# Patient Record
Sex: Male | Born: 1960 | Race: White | Hispanic: No | Marital: Married | Smoking: Current every day smoker
Health system: Southern US, Community
[De-identification: ages and names within clinical notes are randomized; demographics above are authoritative.]

---

## 2018-07-31 ENCOUNTER — Encounter (HOSPITAL_COMMUNITY): Payer: Self-pay | Admitting: Emergency Medicine

## 2018-07-31 ENCOUNTER — Emergency Department (HOSPITAL_COMMUNITY): Payer: BLUE CROSS/BLUE SHIELD

## 2018-07-31 ENCOUNTER — Inpatient Hospital Stay (HOSPITAL_COMMUNITY): Payer: BLUE CROSS/BLUE SHIELD

## 2018-07-31 ENCOUNTER — Inpatient Hospital Stay (HOSPITAL_COMMUNITY)
Admission: EM | Admit: 2018-07-31 | Discharge: 2018-08-28 | DRG: 917 | Disposition: E | Payer: BLUE CROSS/BLUE SHIELD | Attending: Pulmonary Disease | Admitting: Pulmonary Disease

## 2018-07-31 ENCOUNTER — Other Ambulatory Visit: Payer: Self-pay

## 2018-07-31 DIAGNOSIS — E1010 Type 1 diabetes mellitus with ketoacidosis without coma: Secondary | ICD-10-CM | POA: Diagnosis not present

## 2018-07-31 DIAGNOSIS — R402212 Coma scale, best verbal response, none, at arrival to emergency department: Secondary | ICD-10-CM | POA: Diagnosis present

## 2018-07-31 DIAGNOSIS — I469 Cardiac arrest, cause unspecified: Secondary | ICD-10-CM | POA: Diagnosis not present

## 2018-07-31 DIAGNOSIS — Z794 Long term (current) use of insulin: Secondary | ICD-10-CM | POA: Diagnosis not present

## 2018-07-31 DIAGNOSIS — E1011 Type 1 diabetes mellitus with ketoacidosis with coma: Secondary | ICD-10-CM | POA: Diagnosis present

## 2018-07-31 DIAGNOSIS — G92 Toxic encephalopathy: Secondary | ICD-10-CM | POA: Diagnosis present

## 2018-07-31 DIAGNOSIS — J690 Pneumonitis due to inhalation of food and vomit: Secondary | ICD-10-CM | POA: Diagnosis present

## 2018-07-31 DIAGNOSIS — F17200 Nicotine dependence, unspecified, uncomplicated: Secondary | ICD-10-CM | POA: Diagnosis present

## 2018-07-31 DIAGNOSIS — Z789 Other specified health status: Secondary | ICD-10-CM

## 2018-07-31 DIAGNOSIS — R402312 Coma scale, best motor response, none, at arrival to emergency department: Secondary | ICD-10-CM | POA: Diagnosis present

## 2018-07-31 DIAGNOSIS — N179 Acute kidney failure, unspecified: Secondary | ICD-10-CM | POA: Diagnosis present

## 2018-07-31 DIAGNOSIS — I4510 Unspecified right bundle-branch block: Secondary | ICD-10-CM | POA: Diagnosis present

## 2018-07-31 DIAGNOSIS — J9600 Acute respiratory failure, unspecified whether with hypoxia or hypercapnia: Secondary | ICD-10-CM | POA: Diagnosis not present

## 2018-07-31 DIAGNOSIS — Z66 Do not resuscitate: Secondary | ICD-10-CM | POA: Diagnosis not present

## 2018-07-31 DIAGNOSIS — E871 Hypo-osmolality and hyponatremia: Secondary | ICD-10-CM | POA: Diagnosis present

## 2018-07-31 DIAGNOSIS — T405X1A Poisoning by cocaine, accidental (unintentional), initial encounter: Secondary | ICD-10-CM | POA: Diagnosis present

## 2018-07-31 DIAGNOSIS — R571 Hypovolemic shock: Secondary | ICD-10-CM | POA: Diagnosis present

## 2018-07-31 DIAGNOSIS — E876 Hypokalemia: Secondary | ICD-10-CM | POA: Diagnosis present

## 2018-07-31 DIAGNOSIS — G931 Anoxic brain damage, not elsewhere classified: Secondary | ICD-10-CM | POA: Diagnosis present

## 2018-07-31 DIAGNOSIS — E875 Hyperkalemia: Secondary | ICD-10-CM | POA: Diagnosis present

## 2018-07-31 DIAGNOSIS — J9601 Acute respiratory failure with hypoxia: Secondary | ICD-10-CM | POA: Diagnosis present

## 2018-07-31 DIAGNOSIS — I5030 Unspecified diastolic (congestive) heart failure: Secondary | ICD-10-CM | POA: Diagnosis not present

## 2018-07-31 DIAGNOSIS — Z7951 Long term (current) use of inhaled steroids: Secondary | ICD-10-CM | POA: Diagnosis not present

## 2018-07-31 DIAGNOSIS — J9602 Acute respiratory failure with hypercapnia: Secondary | ICD-10-CM | POA: Diagnosis present

## 2018-07-31 DIAGNOSIS — F319 Bipolar disorder, unspecified: Secondary | ICD-10-CM | POA: Diagnosis present

## 2018-07-31 DIAGNOSIS — J9690 Respiratory failure, unspecified, unspecified whether with hypoxia or hypercapnia: Secondary | ICD-10-CM

## 2018-07-31 DIAGNOSIS — E874 Mixed disorder of acid-base balance: Secondary | ICD-10-CM | POA: Diagnosis present

## 2018-07-31 DIAGNOSIS — I10 Essential (primary) hypertension: Secondary | ICD-10-CM | POA: Diagnosis present

## 2018-07-31 DIAGNOSIS — L8990 Pressure ulcer of unspecified site, unspecified stage: Secondary | ICD-10-CM

## 2018-07-31 DIAGNOSIS — Z515 Encounter for palliative care: Secondary | ICD-10-CM | POA: Diagnosis not present

## 2018-07-31 DIAGNOSIS — R402112 Coma scale, eyes open, never, at arrival to emergency department: Secondary | ICD-10-CM | POA: Diagnosis present

## 2018-07-31 DIAGNOSIS — E10641 Type 1 diabetes mellitus with hypoglycemia with coma: Secondary | ICD-10-CM | POA: Diagnosis present

## 2018-07-31 DIAGNOSIS — J189 Pneumonia, unspecified organism: Secondary | ICD-10-CM

## 2018-07-31 DIAGNOSIS — G9340 Encephalopathy, unspecified: Secondary | ICD-10-CM | POA: Diagnosis not present

## 2018-07-31 DIAGNOSIS — Z9641 Presence of insulin pump (external) (internal): Secondary | ICD-10-CM | POA: Diagnosis present

## 2018-07-31 DIAGNOSIS — I468 Cardiac arrest due to other underlying condition: Secondary | ICD-10-CM | POA: Diagnosis present

## 2018-07-31 LAB — APTT: aPTT: 39 seconds — ABNORMAL HIGH (ref 24–36)

## 2018-07-31 LAB — I-STAT CHEM 8, ED
BUN: 83 mg/dL — ABNORMAL HIGH (ref 6–20)
Calcium, Ion: 1.17 mmol/L (ref 1.15–1.40)
Chloride: 85 mmol/L — ABNORMAL LOW (ref 98–111)
Creatinine, Ser: 3.3 mg/dL — ABNORMAL HIGH (ref 0.61–1.24)
Glucose, Bld: >700 mg/dL (ref 70–99)
HCT: 39 % (ref 39.0–52.0)
HEMOGLOBIN: 13.3 g/dL (ref 13.0–17.0)
Potassium: 7.9 mmol/L (ref 3.5–5.1)
SODIUM: 114 mmol/L — AB (ref 135–145)
TCO2: 10 mmol/L — AB (ref 22–32)

## 2018-07-31 LAB — COMPREHENSIVE METABOLIC PANEL
ALK PHOS: 86 U/L (ref 38–126)
ALT: 90 U/L — AB (ref 0–44)
AST: 159 U/L — AB (ref 15–41)
Albumin: 2.7 g/dL — ABNORMAL LOW (ref 3.5–5.0)
Anion gap: 31 — ABNORMAL HIGH (ref 5–15)
BILIRUBIN TOTAL: 1.3 mg/dL — AB (ref 0.3–1.2)
BUN: 70 mg/dL — AB (ref 6–20)
CALCIUM: 7.8 mg/dL — AB (ref 8.9–10.3)
CO2: 9 mmol/L — ABNORMAL LOW (ref 22–32)
Chloride: 84 mmol/L — ABNORMAL LOW (ref 98–111)
Creatinine, Ser: 3.99 mg/dL — ABNORMAL HIGH (ref 0.61–1.24)
GFR calc Af Amer: 18 mL/min — ABNORMAL LOW (ref >60)
GFR calc non Af Amer: 15 mL/min — ABNORMAL LOW (ref >60)
GLUCOSE: 1242 mg/dL — AB (ref 70–99)
Potassium: 5 mmol/L (ref 3.5–5.1)
Sodium: 125 mmol/L — ABNORMAL LOW (ref 135–145)
TOTAL PROTEIN: 4.6 g/dL — AB (ref 6.5–8.1)

## 2018-07-31 LAB — I-STAT ARTERIAL BLOOD GAS, ED
ACID-BASE DEFICIT: 25 mmol/L — AB (ref 0.0–2.0)
Acid-base deficit: 12 mmol/L — ABNORMAL HIGH (ref 0.0–2.0)
Bicarbonate: 9.8 mmol/L — ABNORMAL LOW (ref 20.0–28.0)
Bicarbonate: 15.5 mmol/L — ABNORMAL LOW (ref 20.0–28.0)
O2 SAT: 99 %
O2 Saturation: 99 %
PCO2 ART: 58.1 mmHg — AB (ref 32.0–48.0)
PH ART: 6.821 — AB (ref 7.350–7.450)
PH ART: 7.232 — AB (ref 7.350–7.450)
TCO2: 12 mmol/L — AB (ref 22–32)
TCO2: 17 mmol/L — ABNORMAL LOW (ref 22–32)
pCO2 arterial: 35.5 mmHg (ref 32.0–48.0)
pO2, Arterial: 264 mmHg — ABNORMAL HIGH (ref 83.0–108.0)
pO2, Arterial: 160 mmHg — ABNORMAL HIGH (ref 83.0–108.0)

## 2018-07-31 LAB — CBC WITH DIFFERENTIAL/PLATELET
Basophils Absolute: 0 10*3/uL (ref 0.0–0.1)
Basophils Relative: 0 %
EOS ABS: 0 10*3/uL (ref 0.0–0.5)
Eosinophils Relative: 0 %
HEMATOCRIT: 42.2 % (ref 39.0–52.0)
HEMOGLOBIN: 11.6 g/dL — AB (ref 13.0–17.0)
LYMPHS ABS: 3.8 10*3/uL (ref 0.7–4.0)
Lymphocytes Relative: 11 %
MCH: 30.8 pg (ref 26.0–34.0)
MCHC: 27.5 g/dL — ABNORMAL LOW (ref 30.0–36.0)
MCV: 111.9 fL — ABNORMAL HIGH (ref 80.0–100.0)
MONO ABS: 1.4 10*3/uL — AB (ref 0.1–1.0)
MONOS PCT: 4 %
Metamyelocytes Relative: 2 %
NRBC: 0 /100{WBCs}
Neutro Abs: 29.7 10*3/uL — ABNORMAL HIGH (ref 1.7–7.7)
Neutrophils Relative %: 83 %
Platelets: 298 10*3/uL (ref 150–400)
RBC: 3.77 MIL/uL — ABNORMAL LOW (ref 4.22–5.81)
RDW: 13.9 % (ref 11.5–15.5)
WBC: 34.9 10*3/uL — ABNORMAL HIGH (ref 4.0–10.5)
nRBC: 0.1 % (ref 0.0–0.2)

## 2018-07-31 LAB — URINALYSIS, ROUTINE W REFLEX MICROSCOPIC
Bacteria, UA: NONE SEEN
Bilirubin Urine: NEGATIVE
Glucose, UA: >=500 mg/dL — AB
KETONES UR: 5 mg/dL — AB
LEUKOCYTES UA: NEGATIVE
Nitrite: NEGATIVE
PROTEIN: NEGATIVE mg/dL
Specific Gravity, Urine: 1.02 (ref 1.005–1.030)
pH: 5 (ref 5.0–8.0)

## 2018-07-31 LAB — CBG MONITORING, ED: Glucose-Capillary: >600 mg/dL (ref 70–99)

## 2018-07-31 LAB — AMYLASE: AMYLASE: 19 U/L — AB (ref 28–100)

## 2018-07-31 LAB — VITAMIN B12: Vitamin B-12: 907 pg/mL (ref 180–914)

## 2018-07-31 LAB — TROPONIN I: TROPONIN I: 0.23 ng/mL — AB (ref <0.03)

## 2018-07-31 LAB — LIPID PANEL
Cholesterol: 77 mg/dL (ref 0–200)
HDL: 34 mg/dL — ABNORMAL LOW (ref >40)
LDL Cholesterol: 21 mg/dL (ref 0–99)
Total CHOL/HDL Ratio: 2.3 RATIO
Triglycerides: 112 mg/dL (ref <150)
VLDL: 22 mg/dL (ref 0–40)

## 2018-07-31 LAB — SAMPLE TO BLOOD BANK

## 2018-07-31 LAB — RAPID URINE DRUG SCREEN, HOSP PERFORMED
AMPHETAMINES: NOT DETECTED
Barbiturates: NOT DETECTED
Benzodiazepines: NOT DETECTED
Cocaine: POSITIVE — AB
OPIATES: NOT DETECTED
Tetrahydrocannabinol: NOT DETECTED

## 2018-07-31 LAB — PHOSPHORUS: Phosphorus: 12 mg/dL — ABNORMAL HIGH (ref 2.5–4.6)

## 2018-07-31 LAB — MAGNESIUM: Magnesium: 3 mg/dL — ABNORMAL HIGH (ref 1.7–2.4)

## 2018-07-31 LAB — PROTIME-INR
INR: 1.56
Prothrombin Time: 18.5 seconds — ABNORMAL HIGH (ref 11.4–15.2)

## 2018-07-31 LAB — GLUCOSE, CAPILLARY: Glucose-Capillary: >600 mg/dL (ref 70–99)

## 2018-07-31 LAB — I-STAT TROPONIN, ED: TROPONIN I, POC: 0.27 ng/mL — AB (ref 0.00–0.08)

## 2018-07-31 LAB — BETA-HYDROXYBUTYRIC ACID: BETA-HYDROXYBUTYRIC ACID: 5.58 mmol/L — AB (ref 0.05–0.27)

## 2018-07-31 LAB — I-STAT CG4 LACTIC ACID, ED: Lactic Acid, Venous: 14.33 mmol/L (ref 0.5–1.9)

## 2018-07-31 LAB — CORTISOL: CORTISOL PLASMA: 51.4 ug/dL

## 2018-07-31 LAB — LIPASE, BLOOD: LIPASE: 20 U/L (ref 11–51)

## 2018-07-31 MED ORDER — FENTANYL CITRATE (PF) 100 MCG/2ML IJ SOLN
100.0000 ug | INTRAMUSCULAR | Status: DC | PRN
  Administered 2018-08-01: 100 ug via INTRAVENOUS
  Filled 2018-07-31: qty 2

## 2018-07-31 MED ORDER — ALBUTEROL SULFATE (2.5 MG/3ML) 0.083% IN NEBU: 2.5000 mg | INHALATION_SOLUTION | RESPIRATORY_TRACT | Status: DC | PRN

## 2018-07-31 MED ORDER — SODIUM CHLORIDE 0.9 % IV SOLN: INTRAVENOUS | Status: DC

## 2018-07-31 MED ORDER — BISACODYL 10 MG RE SUPP: 10.0000 mg | Freq: Every day | RECTAL | Status: DC | PRN

## 2018-07-31 MED ORDER — INSULIN REGULAR(HUMAN) IN NACL 100-0.9 UT/100ML-% IV SOLN
INTRAVENOUS | Status: DC
  Administered 2018-07-31: 16.2 [IU]/h via INTRAVENOUS
  Administered 2018-07-31: 5.4 [IU]/h via INTRAVENOUS
  Administered 2018-08-01: 6.4 [IU]/h via INTRAVENOUS
  Administered 2018-08-01: 21.6 [IU]/h via INTRAVENOUS
  Filled 2018-07-31 (×3): qty 100

## 2018-07-31 MED ORDER — VASOPRESSIN 20 UNIT/ML IV SOLN
0.0300 [IU]/min | INTRAVENOUS | Status: DC
  Administered 2018-07-31: 0.03 [IU]/min via INTRAVENOUS
  Filled 2018-07-31: qty 2

## 2018-07-31 MED ORDER — MIDAZOLAM HCL 2 MG/2ML IJ SOLN
2.0000 mg | INTRAMUSCULAR | Status: DC | PRN
  Administered 2018-08-01 – 2018-08-06 (×20): 2 mg via INTRAVENOUS
  Filled 2018-07-31 (×19): qty 2

## 2018-07-31 MED ORDER — SODIUM CHLORIDE 0.9% FLUSH: 10.0000 mL | INTRAVENOUS | Status: DC | PRN

## 2018-07-31 MED ORDER — ETOMIDATE 2 MG/ML IV SOLN
INTRAVENOUS | Status: AC | PRN
  Administered 2018-07-31: 10 mg via INTRAVENOUS

## 2018-07-31 MED ORDER — FAMOTIDINE IN NACL 20-0.9 MG/50ML-% IV SOLN
20.0000 mg | Freq: Two times a day (BID) | INTRAVENOUS | Status: DC
  Administered 2018-08-01: 20 mg via INTRAVENOUS
  Filled 2018-07-31 (×4): qty 50

## 2018-07-31 MED ORDER — IPRATROPIUM-ALBUTEROL 0.5-2.5 (3) MG/3ML IN SOLN
3.0000 mL | Freq: Once | RESPIRATORY_TRACT | Status: AC
  Administered 2018-07-31: 3 mL via RESPIRATORY_TRACT
  Filled 2018-07-31: qty 3

## 2018-07-31 MED ORDER — STERILE WATER FOR INJECTION IV SOLN
INTRAVENOUS | Status: DC
  Administered 2018-07-31: 23:00:00 via INTRAVENOUS
  Filled 2018-07-31 (×4): qty 850

## 2018-07-31 MED ORDER — SODIUM CHLORIDE 0.9 % IV SOLN
INTRAVENOUS | Status: AC | PRN
  Administered 2018-07-31 (×4): 1000 mL via INTRAVENOUS

## 2018-07-31 MED ORDER — INSULIN ASPART 100 UNIT/ML ~~LOC~~ SOLN
10.0000 [IU] | Freq: Once | SUBCUTANEOUS | Status: AC
  Administered 2018-07-31: 10 [IU] via INTRAVENOUS
  Filled 2018-07-31: qty 1

## 2018-07-31 MED ORDER — SODIUM CHLORIDE 0.9 % IV SOLN: INTRAVENOUS | Status: DC | PRN

## 2018-07-31 MED ORDER — SODIUM BICARBONATE 8.4 % IV SOLN
100.0000 meq | Freq: Once | INTRAVENOUS | Status: AC
  Administered 2018-07-31: 100 meq via INTRAVENOUS

## 2018-07-31 MED ORDER — ROCURONIUM BROMIDE 50 MG/5ML IV SOLN
INTRAVENOUS | Status: AC | PRN
  Administered 2018-07-31: 100 mg via INTRAVENOUS

## 2018-07-31 MED ORDER — VANCOMYCIN HCL 10 G IV SOLR
2000.0000 mg | Freq: Once | INTRAVENOUS | Status: AC
  Administered 2018-08-01: 2000 mg via INTRAVENOUS
  Filled 2018-07-31: qty 2000

## 2018-07-31 MED ORDER — SODIUM BICARBONATE 8.4 % IV SOLN
100.0000 meq | Freq: Once | INTRAVENOUS | Status: AC
  Administered 2018-07-31: 100 meq via INTRAVENOUS
  Filled 2018-07-31: qty 100

## 2018-07-31 MED ORDER — HEPARIN SODIUM (PORCINE) 5000 UNIT/ML IJ SOLN
5000.0000 [IU] | Freq: Three times a day (TID) | INTRAMUSCULAR | Status: DC
  Administered 2018-08-01 – 2018-08-07 (×19): 5000 [IU] via SUBCUTANEOUS
  Filled 2018-07-31 (×19): qty 1

## 2018-07-31 MED ORDER — ORAL CARE MOUTH RINSE
15.0000 mL | OROMUCOSAL | Status: DC
  Administered 2018-08-01 – 2018-08-07 (×65): 15 mL via OROMUCOSAL

## 2018-07-31 MED ORDER — DEXTROSE-NACL 5-0.45 % IV SOLN: INTRAVENOUS | Status: DC

## 2018-07-31 MED ORDER — DOCUSATE SODIUM 50 MG/5ML PO LIQD
100.0000 mg | Freq: Two times a day (BID) | ORAL | Status: DC | PRN
  Filled 2018-07-31: qty 10

## 2018-07-31 MED ORDER — VASOPRESSIN 20 UNIT/ML IV SOLN
0.0300 [IU]/min | INTRAVENOUS | Status: DC
  Filled 2018-07-31: qty 2

## 2018-07-31 MED ORDER — CHLORHEXIDINE GLUCONATE 0.12% ORAL RINSE (MEDLINE KIT)
15.0000 mL | Freq: Two times a day (BID) | OROMUCOSAL | Status: DC
  Administered 2018-08-01 – 2018-08-07 (×14): 15 mL via OROMUCOSAL

## 2018-07-31 MED ORDER — HEPARIN SODIUM (PORCINE) 5000 UNIT/ML IJ SOLN: 5000.0000 [IU] | Freq: Three times a day (TID) | INTRAMUSCULAR | Status: DC

## 2018-07-31 MED ORDER — EPINEPHRINE PF 1 MG/ML IJ SOLN
0.5000 ug/min | INTRAVENOUS | Status: DC
  Administered 2018-07-31: 5 ug/min via INTRAVENOUS
  Filled 2018-07-31: qty 4

## 2018-07-31 MED ORDER — VANCOMYCIN HCL 10 G IV SOLR: 1500.0000 mg | INTRAVENOUS | Status: DC

## 2018-07-31 MED ORDER — MIDAZOLAM HCL 2 MG/2ML IJ SOLN
2.0000 mg | INTRAMUSCULAR | Status: AC | PRN
  Administered 2018-08-01 – 2018-08-02 (×3): 2 mg via INTRAVENOUS
  Filled 2018-07-31 (×5): qty 2

## 2018-07-31 MED ORDER — NOREPINEPHRINE 16 MG/250ML-% IV SOLN
0.0000 ug/min | INTRAVENOUS | Status: DC
  Administered 2018-07-31: 10 ug/min via INTRAVENOUS
  Filled 2018-07-31: qty 250

## 2018-07-31 MED ORDER — NOREPINEPHRINE 16 MG/250ML-% IV SOLN
0.0000 ug/min | Freq: Once | INTRAVENOUS | Status: DC
  Administered 2018-07-31: 5 ug/min via INTRAVENOUS
  Filled 2018-07-31: qty 250

## 2018-07-31 MED ORDER — CALCIUM CHLORIDE 10 % IV SOLN
INTRAVENOUS | Status: AC | PRN
  Administered 2018-07-31: 1 g via INTRAVENOUS

## 2018-07-31 MED ORDER — ACETAMINOPHEN 325 MG PO TABS
650.0000 mg | ORAL_TABLET | ORAL | Status: DC | PRN
  Administered 2018-08-02 – 2018-08-06 (×6): 650 mg via ORAL
  Filled 2018-07-31 (×6): qty 2

## 2018-07-31 MED ORDER — PIPERACILLIN-TAZOBACTAM 3.375 G IVPB 30 MIN
3.3750 g | Freq: Once | INTRAVENOUS | Status: AC
  Administered 2018-08-01: 3.375 g via INTRAVENOUS
  Filled 2018-07-31: qty 50

## 2018-07-31 MED ORDER — EPINEPHRINE PF 1 MG/10ML IJ SOSY
PREFILLED_SYRINGE | INTRAMUSCULAR | Status: AC | PRN
  Administered 2018-07-31 (×5): 1 via INTRAVENOUS

## 2018-07-31 MED ORDER — PIPERACILLIN-TAZOBACTAM 3.375 G IVPB
3.3750 g | Freq: Three times a day (TID) | INTRAVENOUS | Status: AC
  Administered 2018-08-01 – 2018-08-06 (×17): 3.375 g via INTRAVENOUS
  Filled 2018-07-31 (×17): qty 50

## 2018-07-31 MED ORDER — CHLORHEXIDINE GLUCONATE CLOTH 2 % EX PADS
6.0000 | MEDICATED_PAD | Freq: Every day | CUTANEOUS | Status: DC
  Administered 2018-08-01 – 2018-08-06 (×6): 6 via TOPICAL

## 2018-07-31 MED ORDER — NALOXONE HCL 2 MG/2ML IJ SOSY
PREFILLED_SYRINGE | INTRAMUSCULAR | Status: AC | PRN
  Administered 2018-07-31: 4 mg via INTRAVENOUS

## 2018-07-31 MED ORDER — SODIUM CHLORIDE 0.9 % IV SOLN
INTRAVENOUS | Status: DC | PRN
  Administered 2018-08-01: 1000 mL via INTRAVENOUS

## 2018-07-31 MED ORDER — SODIUM CHLORIDE 0.9 % IV BOLUS
2000.0000 mL | Freq: Once | INTRAVENOUS | Status: AC
  Administered 2018-07-31: 2000 mL via INTRAVENOUS

## 2018-07-31 MED ORDER — NALOXONE HCL 0.4 MG/ML IJ SOLN
INTRAMUSCULAR | Status: AC
  Filled 2018-07-31: qty 1

## 2018-07-31 MED ORDER — SODIUM CHLORIDE 0.9 % IV SOLN
1.0000 mg | Freq: Once | INTRAVENOUS | Status: AC
  Administered 2018-08-01: 1 mg via INTRAVENOUS
  Filled 2018-07-31: qty 0.2

## 2018-07-31 MED ORDER — IPRATROPIUM-ALBUTEROL 0.5-2.5 (3) MG/3ML IN SOLN
3.0000 mL | Freq: Four times a day (QID) | RESPIRATORY_TRACT | Status: DC
  Administered 2018-08-01 – 2018-08-07 (×26): 3 mL via RESPIRATORY_TRACT
  Filled 2018-07-31 (×25): qty 3

## 2018-07-31 MED ORDER — THIAMINE HCL 100 MG/ML IJ SOLN
100.0000 mg | Freq: Every day | INTRAMUSCULAR | Status: DC
  Administered 2018-08-01 – 2018-08-07 (×7): 100 mg via INTRAVENOUS
  Filled 2018-07-31 (×7): qty 2

## 2018-07-31 MED ORDER — SODIUM CHLORIDE 0.9% FLUSH
10.0000 mL | Freq: Two times a day (BID) | INTRAVENOUS | Status: DC
  Administered 2018-08-01: 20 mL
  Administered 2018-08-01 – 2018-08-02 (×3): 10 mL
  Administered 2018-08-03: 30 mL
  Administered 2018-08-04: 20 mL
  Administered 2018-08-04: 30 mL
  Administered 2018-08-05 – 2018-08-06 (×3): 10 mL

## 2018-07-31 MED ORDER — SODIUM BICARBONATE 8.4 % IV SOLN
INTRAVENOUS | Status: AC
  Filled 2018-07-31: qty 100

## 2018-07-31 MED ORDER — NOREPINEPHRINE 4 MG/250ML-% IV SOLN: 0.0000 ug/min | Freq: Once | INTRAVENOUS | Status: DC

## 2018-07-31 MED ORDER — FENTANYL CITRATE (PF) 100 MCG/2ML IJ SOLN
100.0000 ug | INTRAMUSCULAR | Status: DC | PRN
  Administered 2018-08-01 (×2): 100 ug via INTRAVENOUS
  Filled 2018-07-31 (×2): qty 2

## 2018-07-31 MED ORDER — ONDANSETRON HCL 4 MG/2ML IJ SOLN: 4.0000 mg | Freq: Four times a day (QID) | INTRAMUSCULAR | Status: DC | PRN

## 2018-07-31 MED ORDER — SODIUM CHLORIDE 0.9 % IV SOLN: 250.0000 mL | INTRAVENOUS | Status: DC

## 2018-07-31 NOTE — Progress Notes (Signed)
Patient transported from ED Trauma C to CT and to 2H21 with no complications.

## 2018-07-31 NOTE — ED Notes (Signed)
Ice packs applied. Goal Temp 36C Per Leitha Bleak NP

## 2018-07-31 NOTE — ED Notes (Signed)
ICU at bedside for central line placement

## 2018-07-31 NOTE — ED Triage Notes (Signed)
Pt arrived [facility] where they were called to a hotel for pt being unresponsive. Pt was recently d/c from a rehab for cocaine use. EMS arrived, pt was agnonal breathing with RA sats of 70s%. Vitals with EMS BP 79/41 P60s CapCO2 38 CBG: HIGH.16LEJ in place. Pt was at EMS bridge when he began to become bradycardic and became pulseless. CPR initiated immediately.

## 2018-07-31 NOTE — ED Notes (Signed)
I Stat Trop I results of 0.27 reported to Dr. Otho Najjar

## 2018-07-31 NOTE — ED Provider Notes (Signed)
[facility] EMERGENCY DEPARTMENT Provider Note   CSN: [id number] Arrival date & time: 08/18/2018  1929     History   Chief Complaint Chief Complaint  Patient presents with  . Cardiac Arrest    HPI Ruben Harmon is a 57 y.o. male.  HPI Patient is a 57 year old with a past medical history of diabetes and drug addiction who presents the emergency department via EMS secondary to a cardiac arrest.  EMS was called to patient's hotel room by his friend secondary to patient being unresponsive.  In route to the emergency department patient lost pulses and CPR was initiated.  Patient was actively receiving CPR at the time of arrival to the emergency department.  EMS estimates they have been covering the patient for approximately 10 minutes prior to arrival to the emergency department.  They state patient's blood glucose was too high to register on their glucometer.  The only history that they were able to obtain from the patient's friend at the hotel was that he had been acting more lethargic for the past 24 hours, had multiple episodes of vomiting, and they did use crack cocaine approximately 24 hours prior to him becoming unresponsive.  They report patient was hypotensive prior to cardiac arrest and had EKG findings concerning for ST elevations diffusely.  Level 5 caveat applies as patient is unable to provide further history secondary to his condition.  History reviewed. No pertinent past medical history.  Patient Active Problem List   Diagnosis Date Noted  . Cardiac arrest ([place]) 08/10/2018    History reviewed. No pertinent surgical history.      Home Medications    Prior to Admission medications   Medication Sig Start Date End Date Taking? Authorizing Provider  amoxicillin-clavulanate (AUGMENTIN) 875-125 MG tablet Take 1 tablet by mouth 2 (two) times daily.   Yes [provider]  buPROPion (WELLBUTRIN XL) 300 MG 24 hr tablet Take 300 mg by mouth daily.   Yes  [provider]  cariprazine (VRAYLAR) capsule Take by mouth.   Yes [provider]  desvenlafaxine (PRISTIQ) 100 MG 24 hr tablet Take 100 mg by mouth daily.   Yes [provider]  fluticasone (FLONASE) 50 MCG/ACT nasal spray Place 1 spray into both nostrils daily.   Yes [provider]  Gabapentin Enacarbil 600 MG TBCR Take 1,200 mg by mouth at bedtime.    Yes [provider]  insulin aspart (NOVOLOG) 100 UNIT/ML injection Inject 50 Units into the skin as directed. With pump   Yes [provider]  Melatonin 5 MG SUBL Place 5 mg under the tongue at bedtime as needed (insomnia).   Yes [provider]  Omega-3 Fatty Acids (FISH OIL) 1000 MG CAPS Take 1,000 mg by mouth daily.    Yes [provider]  Pramipexole Dihydrochloride 4.5 MG TB24 Take 4.5 mg by mouth at bedtime.   Yes [provider]  rosuvastatin (CRESTOR) 40 MG tablet Take 40 mg by mouth daily.   Yes [provider]    Family History No family history on file.  Social History Social History   Tobacco Use  . Smoking status: Current Every Day Smoker  . Smokeless tobacco: Never Used  Substance Use Topics  . Alcohol use: Yes  . Drug use: Yes    Types: Cocaine     Allergies   Patient has no known allergies.   Review of Systems Review of Systems  Unable to perform ROS: Acuity of condition  Physical Exam Updated Vital Signs BP 131/62   Pulse 93   Temp (!) 96.6 F (35.9 C)   Resp (!) 26   Ht 5' 6.5" (1.689 m)   Wt 100 kg   SpO2 100%   BMI 35.05 kg/m   Physical Exam  Constitutional: He appears well-developed and well-nourished.  HENT:  Head: Normocephalic and atraumatic.  Neck: Neck supple. No tracheal deviation present.  Cardiovascular: An irregular rhythm present. Tachycardia present.  Pulmonary/Chest:  Patient apneic at the time of arrival. Breath sounds present bilaterally after intubation but diminished.     Abdominal: Soft. He exhibits no distension.  Musculoskeletal: He exhibits no edema or deformity.  Neurological:  Patient unresponsive without meaningful movement.  Skin: Skin is warm and dry.  Psychiatric:  Unable to assess as patient is unresponsive.   Nursing note and vitals reviewed.    ED Treatments / Results  Labs (all labs ordered are listed, but only abnormal results are displayed) Labs Reviewed  CBC WITH DIFFERENTIAL/PLATELET - Abnormal; Notable for the following components:      Result Value   WBC 34.9 (*)    RBC 3.77 (*)    Hemoglobin 11.6 (*)    MCV 111.9 (*)    MCHC 27.5 (*)    Neutro Abs 29.7 (*)    Monocytes Absolute 1.4 (*)    All other components within normal limits  PROTIME-INR - Abnormal; Notable for the following components:   Prothrombin Time 18.5 (*)    All other components within normal limits  APTT - Abnormal; Notable for the following components:   aPTT 39 (*)    All other components within normal limits  URINALYSIS, ROUTINE W REFLEX MICROSCOPIC - Abnormal; Notable for the following components:   Color, Urine STRAW (*)    Glucose, UA >=500 (*)    Hgb urine dipstick SMALL (*)    Ketones, ur 5 (*)    All other components within normal limits  RAPID URINE DRUG SCREEN, HOSP PERFORMED - Abnormal; Notable for the following components:   Cocaine POSITIVE (*)    All other components within normal limits  LACTIC ACID, PLASMA - Abnormal; Notable for the following components:   Lactic Acid, Venous 12.0 (*)    All other components within normal limits  LACTIC ACID, PLASMA - Abnormal; Notable for the following components:   Lactic Acid, Venous 7.7 (*)    All other components within normal limits  BETA-HYDROXYBUTYRIC ACID - Abnormal; Notable for the following components:   Beta-Hydroxybutyric Acid 5.58 (*)    All other components within normal limits  TROPONIN I - Abnormal; Notable for the following components:   Troponin I 0.39 (*)    All other  components within normal limits  TROPONIN I - Abnormal; Notable for the following components:   Troponin I 2.56 (*)    All other components within normal limits  COMPREHENSIVE METABOLIC PANEL - Abnormal; Notable for the following components:   Sodium 125 (*)    Chloride 84 (*)    CO2 9 (*)    Glucose, Bld 1,242 (*)    BUN 70 (*)    Creatinine, Ser 3.99 (*)    Calcium 7.8 (*)    Total Protein 4.6 (*)    Albumin 2.7 (*)    AST 159 (*)    ALT 90 (*)    Total Bilirubin 1.3 (*)    GFR calc non Af Amer 15 (*)    GFR calc Af Amer 18 (*)  Anion gap 31.0 (*)    All other components within normal limits  LIPID PANEL - Abnormal; Notable for the following components:   HDL 34 (*)    All other components within normal limits  TROPONIN I - Abnormal; Notable for the following components:   Troponin I 0.23 (*)    All other components within normal limits  BASIC METABOLIC PANEL - Abnormal; Notable for the following components:   Sodium 129 (*)    Chloride 92 (*)    CO2 16 (*)    Glucose, Bld 1,101 (*)    BUN 67 (*)    Creatinine, Ser 3.62 (*)    Calcium 7.2 (*)    GFR calc non Af Amer 17 (*)    GFR calc Af Amer 20 (*)    Anion gap 21 (*)    All other components within normal limits  BASIC METABOLIC PANEL - Abnormal; Notable for the following components:   Potassium 3.2 (*)    Glucose, Bld 776 (*)    BUN 64 (*)    Creatinine, Ser 3.12 (*)    Calcium 7.2 (*)    GFR calc non Af Amer 21 (*)    GFR calc Af Amer 24 (*)    All other components within normal limits  BASIC METABOLIC PANEL - Abnormal; Notable for the following components:   Glucose, Bld 524 (*)    BUN 65 (*)    Creatinine, Ser 3.07 (*)    Calcium 7.3 (*)    GFR calc non Af Amer 21 (*)    GFR calc Af Amer 24 (*)    All other components within normal limits  AMYLASE - Abnormal; Notable for the following components:   Amylase 19 (*)    All other components within normal limits  MAGNESIUM - Abnormal; Notable for the  following components:   Magnesium 3.0 (*)    All other components within normal limits  PHOSPHORUS - Abnormal; Notable for the following components:   Phosphorus 12.0 (*)    All other components within normal limits  ACETAMINOPHEN LEVEL - Abnormal; Notable for the following components:   Acetaminophen (Tylenol), Serum <10 (*)    All other components within normal limits  GLUCOSE, CAPILLARY - Abnormal; Notable for the following components:   Glucose-Capillary >600 (*)    All other components within normal limits  LACTIC ACID, PLASMA - Abnormal; Notable for the following components:   Lactic Acid, Venous 4.0 (*)    All other components within normal limits  GLUCOSE, CAPILLARY - Abnormal; Notable for the following components:   Glucose-Capillary >600 (*)    All other components within normal limits  GLUCOSE, CAPILLARY - Abnormal; Notable for the following components:   Glucose-Capillary >600 (*)    All other components within normal limits  GLUCOSE, CAPILLARY - Abnormal; Notable for the following components:   Glucose-Capillary >600 (*)    All other components within normal limits  GLUCOSE, CAPILLARY - Abnormal; Notable for the following components:   Glucose-Capillary >600 (*)    All other components within normal limits  GLUCOSE, CAPILLARY - Abnormal; Notable for the following components:   Glucose-Capillary 548 (*)    All other components within normal limits  MAGNESIUM - Abnormal; Notable for the following components:   Magnesium 2.5 (*)    All other components within normal limits  GLUCOSE, CAPILLARY - Abnormal; Notable for the following components:   Glucose-Capillary 486 (*)    All other components within normal limits  GLUCOSE, CAPILLARY - Abnormal; Notable for the following components:   Glucose-Capillary 379 (*)    All other components within normal limits  GLUCOSE, CAPILLARY - Abnormal; Notable for the following components:   Glucose-Capillary 274 (*)    All other  components within normal limits  GLUCOSE, CAPILLARY - Abnormal; Notable for the following components:   Glucose-Capillary 223 (*)    All other components within normal limits  GLUCOSE, CAPILLARY - Abnormal; Notable for the following components:   Glucose-Capillary 185 (*)    All other components within normal limits  CBG MONITORING, ED - Abnormal; Notable for the following components:   Glucose-Capillary >600 (*)    All other components within normal limits  I-STAT CHEM 8, ED - Abnormal; Notable for the following components:   Sodium 114 (*)    Potassium 7.9 (*)    Chloride 85 (*)    BUN 83 (*)    Creatinine, Ser 3.30 (*)    Glucose, Bld >700 (*)    TCO2 10 (*)    All other components within normal limits  I-STAT TROPONIN, ED - Abnormal; Notable for the following components:   Troponin i, poc 0.27 (*)    All other components within normal limits  I-STAT CG4 LACTIC ACID, ED - Abnormal; Notable for the following components:   Lactic Acid, Venous 14.33 (*)    All other components within normal limits  I-STAT ARTERIAL BLOOD GAS, ED - Abnormal; Notable for the following components:   pH, Arterial 6.821 (*)    pCO2 arterial 58.1 (*)    pO2, Arterial 264.0 (*)    Bicarbonate 9.8 (*)    TCO2 12 (*)    Acid-base deficit 25.0 (*)    All other components within normal limits  CBG MONITORING, ED - Abnormal; Notable for the following components:   Glucose-Capillary >600 (*)    All other components within normal limits  I-STAT ARTERIAL BLOOD GAS, ED - Abnormal; Notable for the following components:   pH, Arterial 7.232 (*)    pO2, Arterial 160.0 (*)    Bicarbonate 15.5 (*)    TCO2 17 (*)    Acid-base deficit 12.0 (*)    All other components within normal limits  CBG MONITORING, ED - Abnormal; Notable for the following components:   Glucose-Capillary >600 (*)    All other components within normal limits  POCT I-STAT 3, ART BLOOD GAS (G3+) - Abnormal; Notable for the following  components:   pH, Arterial 7.337 (*)    pCO2 arterial 29.4 (*)    pO2, Arterial 75.0 (*)    Bicarbonate 16.4 (*)    TCO2 17 (*)    Acid-base deficit 9.0 (*)    All other components within normal limits  POCT I-STAT 3, ART BLOOD GAS (G3+) - Abnormal; Notable for the following components:   pH, Arterial 7.345 (*)    pO2, Arterial 116.0 (*)    All other components within normal limits  MRSA PCR SCREENING  CULTURE, BLOOD (ROUTINE X 2)  CULTURE, BLOOD (ROUTINE X 2)  CULTURE, RESPIRATORY  PROCALCITONIN  LIPASE, BLOOD  CORTISOL  VITAMIN Z60  ETHANOL  SALICYLATE LEVEL  PHOSPHORUS  HIV ANTIBODY (ROUTINE TESTING W REFLEX)  TROPONIN I  BASIC METABOLIC PANEL  BASIC METABOLIC PANEL  BASIC METABOLIC PANEL  MAGNESIUM  MAGNESIUM  PHOSPHORUS  PHOSPHORUS  BASIC METABOLIC PANEL  SAMPLE TO BLOOD BANK    EKG EKG Interpretation  Date/Time:  Sunday July 31 2018 20:04:32 EST Ventricular Rate:  77 PR Interval:    QRS Duration: 137 QT Interval:  417 QTC Calculation: 472 R Axis:   100 Text Interpretation:  Junctional rhythm  RBBB and LPFB No STEMI.  Confirmed by Nanda Quinton [phone]) on 08/11/2018 8:44:10 PM   Radiology Ct Abdomen Pelvis Wo Contrast  Result Date: 08/14/2018 CLINICAL DATA:  Cardiac arrest. Patient was found unresponsive. EXAM: CT CHEST, ABDOMEN AND PELVIS WITHOUT CONTRAST TECHNIQUE: Multidetector CT imaging of the chest, abdomen and pelvis was performed following the standard protocol without IV contrast. COMPARISON:  None. FINDINGS: CT CHEST FINDINGS Cardiovascular: Evaluation of vascular structures is limited without IV contrast material. Normal heart size. No pericardial effusion. Normal caliber thoracic aorta. Scattered aortic calcifications. Mediastinum/Nodes: Endotracheal tube present with tip above the carina. Enteric tube present. Esophagus is mostly decompressed. No significant lymphadenopathy or fluid collection in the mediastinum. Lungs/Pleura: Patchy airspace  infiltrates throughout both lungs with consolidation in the lung bases. This could represent edema, pneumonia, aspiration, or contusion. Airways are patent. No pleural effusions. No pneumothorax. Musculoskeletal: Degenerative changes in the spine. No acute displaced fractures identified. CT ABDOMEN PELVIS FINDINGS Hepatobiliary: Diffuse fatty infiltration of the liver. No gallstones or bile duct dilatation identified. Pancreas: Unenhanced appearance is unremarkable. Spleen: Normal size. No focal lesions identified. Adrenals/Urinary Tract: No adrenal gland nodules. Kidneys are symmetrical in size. No hydronephrosis or hydroureter. No urinary tract stones identified. Foley catheter decompresses the bladder. Gas in the bladder consistent with catheter insertion. No bladder wall thickening. Stomach/Bowel: Enteric tube tip is in the body of the stomach. Stomach, small bowel, and colon are not abnormally distended. Scattered stool throughout the colon. No wall thickening is appreciated. Appendix is not identified. Vascular/Lymphatic: Aortic atherosclerosis. No enlarged abdominal or pelvic lymph nodes. Right femoral arterial and venous catheters are present. Reproductive: Prostate gland is enlarged, measuring about 5.1 cm diameter. Calcification in the vas deferens. Other: No free air or free fluid in the abdomen. Abdominal wall musculature appears intact. Musculoskeletal: Degenerative changes in the spine. No acute fractures identified. IMPRESSION: 1. Patchy airspace infiltrates throughout both lungs with consolidation in the lung bases. This could represent edema, pneumonia, aspiration, or contusion. 2. No acute process demonstrated in the abdomen or pelvis. 3. Diffuse fatty infiltration of the liver. 4. Enlarged prostate gland. Aortic Atherosclerosis (ICD10-I70.0). Electronically Signed   By: Lucienne Capers M.D.   On: 08/13/2018 23:16   Ct Head Wo Contrast  Result Date: 08/20/2018 CLINICAL DATA:  Patient found  down. EXAM: CT HEAD WITHOUT CONTRAST CT CERVICAL SPINE WITHOUT CONTRAST TECHNIQUE: Multidetector CT imaging of the head and cervical spine was performed following the standard protocol without intravenous contrast. Multiplanar CT image reconstructions of the cervical spine were also generated. COMPARISON:  None. FINDINGS: CT HEAD FINDINGS Brain: No subdural, epidural, or subarachnoid hemorrhage. Cerebellum, brainstem, and basal cisterns are normal. Ventricles and sulci are unremarkable. No mass effect or midline shift. No acute cortical ischemia or infarct. Vascular: Calcified atherosclerosis in the intracranial carotids. Skull: Normal. Negative for fracture or focal lesion. Sinuses/Orbits: Opacification of scattered ethmoid air cells in the left sphenoid sinus. Fluid in the maxillary sinuses. Mastoid air cells and middle ears are well aerated. Other: None. CT CERVICAL SPINE FINDINGS Alignment: Normal. Skull base and vertebrae: No acute fracture. No primary bone lesion or focal pathologic process. Soft tissues and spinal canal: No prevertebral fluid or swelling. No visible canal hematoma. Disc levels:  Multilevel degenerative changes. Upper chest: Mild ground-glass in the left apex is likely infectious or inflammatory. ETT and  NG tubes noted. Other: No other abnormalities. IMPRESSION: 1. No acute intracranial abnormalities. 2. Sinus disease as above.  Fluid in the maxillary sinuses. 3. No fracture or traumatic malalignment in the cervical spine. 4. Mild ground-glass in the left lung apex is likely infectious or inflammatory. Electronically Signed   By: Dorise Bullion III M.D   On: 08/27/2018 23:17   Ct Chest Wo Contrast  Result Date: 08/26/2018 CLINICAL DATA:  Cardiac arrest. Patient was found unresponsive. EXAM: CT CHEST, ABDOMEN AND PELVIS WITHOUT CONTRAST TECHNIQUE: Multidetector CT imaging of the chest, abdomen and pelvis was performed following the standard protocol without IV contrast. COMPARISON:  None.  FINDINGS: CT CHEST FINDINGS Cardiovascular: Evaluation of vascular structures is limited without IV contrast material. Normal heart size. No pericardial effusion. Normal caliber thoracic aorta. Scattered aortic calcifications. Mediastinum/Nodes: Endotracheal tube present with tip above the carina. Enteric tube present. Esophagus is mostly decompressed. No significant lymphadenopathy or fluid collection in the mediastinum. Lungs/Pleura: Patchy airspace infiltrates throughout both lungs with consolidation in the lung bases. This could represent edema, pneumonia, aspiration, or contusion. Airways are patent. No pleural effusions. No pneumothorax. Musculoskeletal: Degenerative changes in the spine. No acute displaced fractures identified. CT ABDOMEN PELVIS FINDINGS Hepatobiliary: Diffuse fatty infiltration of the liver. No gallstones or bile duct dilatation identified. Pancreas: Unenhanced appearance is unremarkable. Spleen: Normal size. No focal lesions identified. Adrenals/Urinary Tract: No adrenal gland nodules. Kidneys are symmetrical in size. No hydronephrosis or hydroureter. No urinary tract stones identified. Foley catheter decompresses the bladder. Gas in the bladder consistent with catheter insertion. No bladder wall thickening. Stomach/Bowel: Enteric tube tip is in the body of the stomach. Stomach, small bowel, and colon are not abnormally distended. Scattered stool throughout the colon. No wall thickening is appreciated. Appendix is not identified. Vascular/Lymphatic: Aortic atherosclerosis. No enlarged abdominal or pelvic lymph nodes. Right femoral arterial and venous catheters are present. Reproductive: Prostate gland is enlarged, measuring about 5.1 cm diameter. Calcification in the vas deferens. Other: No free air or free fluid in the abdomen. Abdominal wall musculature appears intact. Musculoskeletal: Degenerative changes in the spine. No acute fractures identified. IMPRESSION: 1. Patchy airspace  infiltrates throughout both lungs with consolidation in the lung bases. This could represent edema, pneumonia, aspiration, or contusion. 2. No acute process demonstrated in the abdomen or pelvis. 3. Diffuse fatty infiltration of the liver. 4. Enlarged prostate gland. Aortic Atherosclerosis (ICD10-I70.0). Electronically Signed   By: Lucienne Capers M.D.   On: 08/15/2018 23:16   Ct Cervical Spine Wo Contrast  Result Date: 08/17/2018 CLINICAL DATA:  Patient found down. EXAM: CT HEAD WITHOUT CONTRAST CT CERVICAL SPINE WITHOUT CONTRAST TECHNIQUE: Multidetector CT imaging of the head and cervical spine was performed following the standard protocol without intravenous contrast. Multiplanar CT image reconstructions of the cervical spine were also generated. COMPARISON:  None. FINDINGS: CT HEAD FINDINGS Brain: No subdural, epidural, or subarachnoid hemorrhage. Cerebellum, brainstem, and basal cisterns are normal. Ventricles and sulci are unremarkable. No mass effect or midline shift. No acute cortical ischemia or infarct. Vascular: Calcified atherosclerosis in the intracranial carotids. Skull: Normal. Negative for fracture or focal lesion. Sinuses/Orbits: Opacification of scattered ethmoid air cells in the left sphenoid sinus. Fluid in the maxillary sinuses. Mastoid air cells and middle ears are well aerated. Other: None. CT CERVICAL SPINE FINDINGS Alignment: Normal. Skull base and vertebrae: No acute fracture. No primary bone lesion or focal pathologic process. Soft tissues and spinal canal: No prevertebral fluid or swelling. No visible canal hematoma. Disc levels:  Multilevel degenerative changes. Upper chest: Mild ground-glass in the left apex is likely infectious or inflammatory. ETT and NG tubes noted. Other: No other abnormalities. IMPRESSION: 1. No acute intracranial abnormalities. 2. Sinus disease as above.  Fluid in the maxillary sinuses. 3. No fracture or traumatic malalignment in the cervical spine. 4. Mild  ground-glass in the left lung apex is likely infectious or inflammatory. Electronically Signed   By: Dorise Bullion III M.D   On: 08/03/2018 23:17   Dg Chest Port 1 View  Result Date: 08/13/2018 CLINICAL DATA:  Status post CPR EXAM: PORTABLE CHEST 1 VIEW COMPARISON:  None. FINDINGS: Cardiac shadow is within normal limits. Endotracheal tube and nasogastric catheter are noted in satisfactory position. The lungs are clear. No bony abnormality is seen. No pneumothorax is noted. IMPRESSION: No acute abnormality.  Tubes and lines as described. Electronically Signed   By: Inez Catalina M.D.   On: 08/05/2018 20:18    Procedures Procedure Name: Intubation Date/Time: 08/09/2018 7:40 PM Performed by: Melina Copa, MD Pre-anesthesia Checklist: Emergency Drugs available, Patient identified, Suction available and Patient being monitored Oxygen Delivery Method: Ambu bag Preoxygenation: Pre-oxygenation with 100% oxygen Induction Type: Rapid sequence Ventilation: Mask ventilation without difficulty Laryngoscope Size: Mac and 4 Grade View: Grade I Tube size: 7.5 mm Number of attempts: 1 Airway Equipment and Method: Stylet and Video-laryngoscopy Placement Confirmation: ETT inserted through vocal cords under direct vision,  Positive ETCO2,  CO2 detector and Breath sounds checked- equal and bilateral Secured at: 23 cm Tube secured with: ETT holder      (including critical care time)  Medications Ordered in ED Medications  naloxone (NARCAN) 0.4 MG/ML injection (has no administration in time range)  insulin regular, human (MYXREDLIN) 100 units/ 100 mL infusion ( Intravenous Rate/Dose Verify 08/01/18 1200)  acetaminophen (TYLENOL) tablet 650 mg (has no administration in time range)  ondansetron (ZOFRAN) injection 4 mg (has no administration in time range)  heparin injection 5,000 Units (5,000 Units Subcutaneous Given 08/01/18 0651)  midazolam (VERSED) injection 2 mg (2 mg Intravenous Given 08/01/18  0509)  midazolam (VERSED) injection 2 mg (2 mg Intravenous Given 08/01/18 1055)  bisacodyl (DULCOLAX) suppository 10 mg (has no administration in time range)  docusate (COLACE) 50 MG/5ML liquid 100 mg (has no administration in time range)  thiamine (B-1) injection 100 mg (100 mg Intravenous Given 08/01/18 0832)  0.9 %  sodium chloride infusion (has no administration in time range)  ipratropium-albuterol (DUONEB) 0.5-2.5 (3) MG/3ML nebulizer solution 3 mL (3 mLs Nebulization Given 08/01/18 1314)  albuterol (PROVENTIL) (2.5 MG/3ML) 0.083% nebulizer solution 2.5 mg (has no administration in time range)  vancomycin (VANCOCIN) 1,500 mg in sodium chloride 0.9 % 500 mL IVPB (has no administration in time range)  piperacillin-tazobactam (ZOSYN) IVPB 3.375 g ( Intravenous Rate/Dose Verify 08/01/18 1200)  sodium chloride flush (NS) 0.9 % injection 10-40 mL (20 mLs Intracatheter Given 08/01/18 0940)  sodium chloride flush (NS) 0.9 % injection 10-40 mL (has no administration in time range)  Chlorhexidine Gluconate Cloth 2 % PADS 6 each (6 each Topical Given 08/01/18 1008)  chlorhexidine gluconate (MEDLINE KIT) (PERIDEX) 0.12 % solution 15 mL (15 mLs Mouth Rinse Given 08/01/18 0800)  MEDLINE mouth rinse (15 mLs Mouth Rinse Given 08/01/18 1200)  0.9 %  sodium chloride infusion ( Intravenous Rate/Dose Verify 08/01/18 1200)  0.9 %  sodium chloride infusion ( Intravenous Paused 08/01/18 0930)  0.9 %  sodium chloride infusion ( Intravenous Rate/Dose Verify 08/01/18 1200)  fentaNYL 2569mg in  NS 239m (134m/ml) infusion-PREMIX (300 mcg/hr Intravenous Rate/Dose Verify 08/01/18 1200)  fentaNYL (SUBLIMAZE) bolus via infusion 50 mcg (has no administration in time range)  feeding supplement (PRO-STAT SUGAR FREE 64) liquid 30 mL (has no administration in time range)  famotidine (PEPCID) IVPB 20 mg premix ( Intravenous Stopped 1137/1/6916789 folic acid (FOLVITE) tablet 1 mg (has no administration in time range)  feeding  supplement (VITAL HIGH PROTEIN) liquid 1,000 mL (has no administration in time range)  Influenza vac split quadrivalent PF (FLUARIX) injection 0.5 mL (has no administration in time range)  pneumococcal 23 valent vaccine (PNU-IMMUNE) injection 0.5 mL (has no administration in time range)  norepinephrine (LEVOPHED) 16 mg in D5W 25040mremix infusion (5 mcg/min Intravenous New Bag/Given 08/01/18 1313)  EPINEPHrine (ADRENALIN) 1 MG/10ML injection (1 Syringe Intravenous Given 08/05/2018 1942)  calcium chloride injection (1 g Intravenous Given 08/08/2018 1928)  etomidate (AMIDATE) injection (10 mg Intravenous Given 08/06/2018 1929)  rocuronium (ZEMURON) injection (100 mg Intravenous Given 08/03/2018 1929)  naloxone (NARCAN) injection (4 mg Intravenous Given 08/08/2018 1937)  insulin aspart (novoLOG) injection 10 Units (10 Units Intravenous Given 08/11/2018 2021)  ipratropium-albuterol (DUONEB) 0.5-2.5 (3) MG/3ML nebulizer solution 3 mL (3 mLs Nebulization Given 07/30/2018 2031)  sodium bicarbonate injection 100 mEq (100 mEq Intravenous Given 08/02/2018 2048)  sodium bicarbonate injection 100 mEq (100 mEq Intravenous Given 11/38/1/0117510folic acid 1 mg in sodium chloride 0.9 % 50 mL IVPB ( Intravenous Stopped 08/01/18 0127)  sodium bicarbonate injection 100 mEq (100 mEq Intravenous Given 08/01/2018 2213)  0.9 %  sodium chloride infusion ( Intravenous Stopped 07/29/2018 2315)  sodium chloride 0.9 % bolus 2,000 mL ( Intravenous Rate/Dose Verify 08/01/18 0000)  piperacillin-tazobactam (ZOSYN) IVPB 3.375 g ( Intravenous Stopped 08/01/18 0159)  vancomycin (VANCOCIN) 2,000 mg in sodium chloride 0.9 % 500 mL IVPB ( Intravenous Stopped 08/01/18 0322)  potassium chloride 10 mEq in 50 mL *CENTRAL LINE* IVPB ( Intravenous Stopped 08/01/18 1053)  potassium chloride 20 MEQ/15ML (10%) solution 40 mEq (40 mEq Per Tube Given 08/01/18 0832)  fentaNYL (SUBLIMAZE) injection 50 mcg (50 mcg Intravenous Given 08/01/18 0802585   Initial Impression /  Assessment and Plan / ED Course  I have reviewed the triage vital signs and the nursing notes.  Pertinent labs & imaging results that were available during my care of the patient were reviewed by me and considered in my medical decision making (see chart for details).     Patient is a 57 44ar old male with a past medical history as detailed above who presents to the emergency department with active cardiac post arrest.  Upon arrival to the emergency department CPR was in progress.  Patient was immediately transferred to the resuscitation bay bed where CPR was continued.  Patient received 5 rounds of epinephrine, calcium, bicarbonate, and Narcan during the resuscitation after which return of spontaneous circulation was achieved.  Prior to return of spontaneous circulation patient was intubated with a 7.5 ET tube.  After achieving return of spontaneous circulation a bedside echo was performed which did not show evidence of tamponade but did show evidence of global hypokinesis.  Repeat EKGs show widened QRS and no specific ST abnormalities.  Laboratory studies were obtained and results are detailed above but briefly do show significantly elevated blood sugars of greater than 700, severe hyperkalemia, and severe lactic acidosis.  Further history was obtained from patient's friend who reports he had been nauseous and vomiting for approximately 24 hours prior to becoming unresponsive.  He is unsure how long he may have been without insulin as his pump appeared to be empty.  He denies any other drugs outside of crack cocaine.  Given patient's severe hyperkalemia he was started on insulin drip while in the emergency department and given albuterol through his ET tube.  Given the acuity of patient's condition the ICU was contacted for admission.  They initiated a code cool for the patient.  For further information regarding patient's continued hospital course please see the ICU team's documentation.  I am concerned  patient's presentation is likely secondary to severe hyperkalemia related to DKA.  Patient does have an elevated troponin that may be likely to ischemia caused by his cardiac arrest, but it cannot be ruled out but this did not contribute to his cardiac arrest at this time.  The care of this patient was discussed with my attending physician Dr. Laverta Baltimore, who voices agreement with work-up and ED disposition.  Final Clinical Impressions(s) / ED Diagnoses   Final diagnoses:  Cardiopulmonary resuscitation (CPR)-only resuscitation status  Diabetic ketoacidosis with coma associated with type 1 diabetes mellitus ([place])  Hyperkalemia    ED Discharge Orders    None       Kiyan Burmester, Chanda Busing, MD 08/01/18 1359    Margette Fast, MD 08/01/18 1727

## 2018-07-31 NOTE — ED Notes (Signed)
NP collected labs.

## 2018-07-31 NOTE — Procedures (Signed)
Central Venous Catheter Insertion Procedure Note Ruben Harmon [id number] [birth year]  Procedure: Insertion of Central Venous Catheter Indications: Assessment of intravascular volume, Drug and/or fluid administration and Frequent blood sampling  Procedure Details Consent: Unable to obtain consent because of emergent medical necessity. Time Out: Verified patient identification, verified procedure, site/side was marked, verified correct patient position, special equipment/implants available, medications/allergies/relevent history reviewed, required imaging and test results available.  Performed  Maximum sterile technique was used including antiseptics, cap, gloves, gown, hand hygiene, mask and sheet. Skin prep: Chlorhexidine; local anesthetic administered A antimicrobial bonded/coated triple lumen catheter was placed in the right femoral vein due to multiple attempts, no other available access using the Seldinger technique.  Evaluation Blood flow good Complications: No apparent complications Patient did tolerate procedure well.  [name], AGACNP-BC [facility]  PCCM Pgr: [phone]

## 2018-07-31 NOTE — ED Notes (Addendum)
I Stat Chem 8 Results of Na 114, K 7.9, Glu >700, I Stat Lacid Acid results of 14.33 reported to Dr. Otho Najjar, and Dr. Jacqulyn Bath

## 2018-07-31 NOTE — Progress Notes (Signed)
Pharmacy Antibiotic Note  Ruben Harmon is a 57 y.o. male admitted on 08/11/2018 with sepsis.  Pharmacy has been consulted for vancomycin and zosyn dosing.  Plan: Vancomycin 2gm IV x 1 then 1500 mg IV q48 hours Zosyn 3.375 gm IV q8 hours F/u renal function, cultures and clinical course  Height: 5' 6.5" (168.9 cm) Weight: 220 lb 7.4 oz (100 kg) IBW/kg (Calculated) : 64.95  Temp (24hrs), Avg:94.9 F (34.9 C), Min:93.7 F (34.3 C), Max:95.7 F (35.4 C)  Recent Labs  Lab 08/21/2018 1949 08/03/2018 1957  WBC 34.9*  --   CREATININE  --  3.30*  LATICACIDVEN  --  14.33*    Estimated Creatinine Clearance: 27.6 mL/min (A) (by C-G formula based on SCr of 3.3 mg/dL (H)).    No Known Allergies   Thank you for allowing pharmacy to be a part of this patient's care.  Talbert Cage [place] 08/11/2018 11:15 PM

## 2018-07-31 NOTE — Procedures (Signed)
Arterial Catheter Insertion Procedure Note Ruben Harmon [id number] [birth year]  Procedure: Insertion of Arterial Catheter  Indications: Blood pressure monitoring  Procedure Details Consent: Unable to obtain consent because of emergent medical necessity. Time Out: Verified patient identification, verified procedure, site/side was marked, verified correct patient position, special equipment/implants available, medications/allergies/relevent history reviewed, required imaging and test results available.  Performed  Maximum sterile technique was used including antiseptics, cap, gloves, gown, hand hygiene, mask and sheet. Skin prep: Chlorhexidine; local anesthetic administered 24 gauge catheter was inserted into right femoral artery using the Seldinger technique. ULTRASOUND GUIDANCE USED: YES Evaluation Blood flow good; BP tracing good. Complications: No apparent complications.  Ruben Harmon, AGACNP-BC [facility]  PCCM Pgr: [phone]

## 2018-07-31 NOTE — Progress Notes (Signed)
RT assisted ED MD Long with intubation of CPR in progress pt that came in coding via EMS. Pt was intubated with a 7.5 ETT, 25@lip , CP 30, tube holder in place and secure, pt measured x3 for a height of 66.5 inches tall (8cc VT of 510).   Vent settings  PRVC VT 510 PEEP 12 FIO2 100 Rate of 15  RT will continue to monitor.

## 2018-08-01 ENCOUNTER — Inpatient Hospital Stay (HOSPITAL_COMMUNITY): Payer: BLUE CROSS/BLUE SHIELD

## 2018-08-01 DIAGNOSIS — E1010 Type 1 diabetes mellitus with ketoacidosis without coma: Secondary | ICD-10-CM

## 2018-08-01 DIAGNOSIS — I5030 Unspecified diastolic (congestive) heart failure: Secondary | ICD-10-CM

## 2018-08-01 DIAGNOSIS — I469 Cardiac arrest, cause unspecified: Secondary | ICD-10-CM

## 2018-08-01 DIAGNOSIS — J9600 Acute respiratory failure, unspecified whether with hypoxia or hypercapnia: Secondary | ICD-10-CM

## 2018-08-01 LAB — POCT I-STAT 3, ART BLOOD GAS (G3+)
Acid-base deficit: 9 mmol/L — ABNORMAL HIGH (ref 0.0–2.0)
BICARBONATE: 25.9 mmol/L (ref 20.0–28.0)
Bicarbonate: 16.4 mmol/L — ABNORMAL LOW (ref 20.0–28.0)
O2 SAT: 96 %
O2 Saturation: 98 %
PCO2 ART: 29.4 mmHg — AB (ref 32.0–48.0)
PH ART: 7.345 — AB (ref 7.350–7.450)
PO2 ART: 116 mmHg — AB (ref 83.0–108.0)
PO2 ART: 75 mmHg — AB (ref 83.0–108.0)
Patient temperature: 33.4
Patient temperature: 36.4
TCO2: 17 mmol/L — AB (ref 22–32)
TCO2: 27 mmol/L (ref 22–32)
pCO2 arterial: 47.3 mmHg (ref 32.0–48.0)
pH, Arterial: 7.337 — ABNORMAL LOW (ref 7.350–7.450)

## 2018-08-01 LAB — BASIC METABOLIC PANEL
ANION GAP: 21 — AB (ref 5–15)
ANION GAP: 11 (ref 5–15)
ANION GAP: 13 (ref 5–15)
ANION GAP: 11 (ref 5–15)
ANION GAP: 14 (ref 5–15)
BUN: 67 mg/dL — ABNORMAL HIGH (ref 6–20)
BUN: 64 mg/dL — ABNORMAL HIGH (ref 6–20)
BUN: 65 mg/dL — ABNORMAL HIGH (ref 6–20)
BUN: 65 mg/dL — AB (ref 6–20)
BUN: 63 mg/dL — AB (ref 6–20)
CALCIUM: 7.6 mg/dL — AB (ref 8.9–10.3)
CALCIUM: 7.9 mg/dL — AB (ref 8.9–10.3)
CHLORIDE: 92 mmol/L — AB (ref 98–111)
CHLORIDE: 98 mmol/L (ref 98–111)
CHLORIDE: 100 mmol/L (ref 98–111)
CO2: 16 mmol/L — ABNORMAL LOW (ref 22–32)
CO2: 26 mmol/L (ref 22–32)
CO2: 26 mmol/L (ref 22–32)
CO2: 27 mmol/L (ref 22–32)
CO2: 25 mmol/L (ref 22–32)
CREATININE: 2.97 mg/dL — AB (ref 0.61–1.24)
Calcium: 7.2 mg/dL — ABNORMAL LOW (ref 8.9–10.3)
Calcium: 7.2 mg/dL — ABNORMAL LOW (ref 8.9–10.3)
Calcium: 7.3 mg/dL — ABNORMAL LOW (ref 8.9–10.3)
Chloride: 104 mmol/L (ref 98–111)
Chloride: 104 mmol/L (ref 98–111)
Creatinine, Ser: 3.62 mg/dL — ABNORMAL HIGH (ref 0.61–1.24)
Creatinine, Ser: 3.12 mg/dL — ABNORMAL HIGH (ref 0.61–1.24)
Creatinine, Ser: 3.07 mg/dL — ABNORMAL HIGH (ref 0.61–1.24)
Creatinine, Ser: 2.85 mg/dL — ABNORMAL HIGH (ref 0.61–1.24)
GFR calc Af Amer: 20 mL/min — ABNORMAL LOW (ref >60)
GFR calc Af Amer: 27 mL/min — ABNORMAL LOW (ref >60)
GFR calc Af Amer: 25 mL/min — ABNORMAL LOW (ref >60)
GFR calc non Af Amer: 22 mL/min — ABNORMAL LOW (ref >60)
GFR, EST AFRICAN AMERICAN: 24 mL/min — AB (ref >60)
GFR, EST AFRICAN AMERICAN: 24 mL/min — AB (ref >60)
GFR, EST NON AFRICAN AMERICAN: 17 mL/min — AB (ref >60)
GFR, EST NON AFRICAN AMERICAN: 21 mL/min — AB (ref >60)
GFR, EST NON AFRICAN AMERICAN: 21 mL/min — AB (ref >60)
GFR, EST NON AFRICAN AMERICAN: 23 mL/min — AB (ref >60)
GLUCOSE: 177 mg/dL — AB (ref 70–99)
GLUCOSE: 160 mg/dL — AB (ref 70–99)
Glucose, Bld: 1101 mg/dL (ref 70–99)
Glucose, Bld: 776 mg/dL (ref 70–99)
Glucose, Bld: 524 mg/dL (ref 70–99)
POTASSIUM: 3.6 mmol/L (ref 3.5–5.1)
POTASSIUM: 3.2 mmol/L — AB (ref 3.5–5.1)
POTASSIUM: 3.6 mmol/L (ref 3.5–5.1)
Potassium: 3.7 mmol/L (ref 3.5–5.1)
Potassium: 4.1 mmol/L (ref 3.5–5.1)
SODIUM: 135 mmol/L (ref 135–145)
Sodium: 129 mmol/L — ABNORMAL LOW (ref 135–145)
Sodium: 139 mmol/L (ref 135–145)
Sodium: 142 mmol/L (ref 135–145)
Sodium: 143 mmol/L (ref 135–145)

## 2018-08-01 LAB — TROPONIN I
TROPONIN I: 2.56 ng/mL — AB (ref <0.03)
TROPONIN I: 2.61 ng/mL — AB (ref <0.03)
Troponin I: 0.39 ng/mL (ref <0.03)

## 2018-08-01 LAB — ACETAMINOPHEN LEVEL: Acetaminophen (Tylenol), Serum: <10 ug/mL — ABNORMAL LOW (ref 10–30)

## 2018-08-01 LAB — LACTIC ACID, PLASMA
LACTIC ACID, VENOUS: 12 mmol/L — AB (ref 0.5–1.9)
Lactic Acid, Venous: 7.7 mmol/L (ref 0.5–1.9)
Lactic Acid, Venous: 4 mmol/L (ref 0.5–1.9)

## 2018-08-01 LAB — GLUCOSE, CAPILLARY
GLUCOSE-CAPILLARY: 548 mg/dL — AB (ref 70–99)
GLUCOSE-CAPILLARY: 379 mg/dL — AB (ref 70–99)
GLUCOSE-CAPILLARY: 223 mg/dL — AB (ref 70–99)
GLUCOSE-CAPILLARY: 133 mg/dL — AB (ref 70–99)
GLUCOSE-CAPILLARY: 121 mg/dL — AB (ref 70–99)
GLUCOSE-CAPILLARY: 195 mg/dL — AB (ref 70–99)
Glucose-Capillary: >600 mg/dL (ref 70–99)
Glucose-Capillary: >600 mg/dL (ref 70–99)
Glucose-Capillary: >600 mg/dL (ref 70–99)
Glucose-Capillary: >600 mg/dL (ref 70–99)
Glucose-Capillary: 486 mg/dL — ABNORMAL HIGH (ref 70–99)
Glucose-Capillary: 274 mg/dL — ABNORMAL HIGH (ref 70–99)
Glucose-Capillary: 185 mg/dL — ABNORMAL HIGH (ref 70–99)
Glucose-Capillary: 159 mg/dL — ABNORMAL HIGH (ref 70–99)
Glucose-Capillary: 128 mg/dL — ABNORMAL HIGH (ref 70–99)
Glucose-Capillary: 136 mg/dL — ABNORMAL HIGH (ref 70–99)

## 2018-08-01 LAB — PHOSPHORUS
PHOSPHORUS: 4.2 mg/dL (ref 2.5–4.6)
PHOSPHORUS: 3.5 mg/dL (ref 2.5–4.6)
Phosphorus: 3.6 mg/dL (ref 2.5–4.6)

## 2018-08-01 LAB — MRSA PCR SCREENING: MRSA BY PCR: NEGATIVE

## 2018-08-01 LAB — MAGNESIUM
Magnesium: 2.5 mg/dL — ABNORMAL HIGH (ref 1.7–2.4)
Magnesium: 2.3 mg/dL (ref 1.7–2.4)
Magnesium: 2.5 mg/dL — ABNORMAL HIGH (ref 1.7–2.4)

## 2018-08-01 LAB — PROCALCITONIN: Procalcitonin: 22.99 ng/mL

## 2018-08-01 LAB — ETHANOL

## 2018-08-01 LAB — ECHOCARDIOGRAM COMPLETE
HEIGHTINCHES: 66.5 in
WEIGHTICAEL: 3527.36 [oz_av]

## 2018-08-01 LAB — HIV ANTIBODY (ROUTINE TESTING W REFLEX): HIV SCREEN 4TH GENERATION: NONREACTIVE

## 2018-08-01 LAB — SALICYLATE LEVEL: Salicylate Lvl: <7 mg/dL (ref 2.8–30.0)

## 2018-08-01 MED ORDER — FENTANYL 2500MCG IN NS 250ML (10MCG/ML) PREMIX INFUSION
25.0000 ug/h | INTRAVENOUS | Status: DC
  Administered 2018-08-01: 50 ug/h via INTRAVENOUS
  Administered 2018-08-01 – 2018-08-02 (×2): 300 ug/h via INTRAVENOUS
  Filled 2018-08-01 (×3): qty 250

## 2018-08-01 MED ORDER — INSULIN ASPART 100 UNIT/ML ~~LOC~~ SOLN
3.0000 [IU] | SUBCUTANEOUS | Status: DC
  Administered 2018-08-01 (×2): 6 [IU] via SUBCUTANEOUS

## 2018-08-01 MED ORDER — NOREPINEPHRINE 16 MG/250ML-% IV SOLN
0.0000 ug/min | INTRAVENOUS | Status: DC
  Administered 2018-08-01: 5 ug/min via INTRAVENOUS
  Administered 2018-08-02: 3 ug/min via INTRAVENOUS
  Filled 2018-08-01 (×2): qty 250

## 2018-08-01 MED ORDER — FENTANYL BOLUS VIA INFUSION
50.0000 ug | INTRAVENOUS | Status: DC | PRN
  Filled 2018-08-01: qty 50

## 2018-08-01 MED ORDER — FENTANYL CITRATE (PF) 100 MCG/2ML IJ SOLN
50.0000 ug | Freq: Once | INTRAMUSCULAR | Status: AC
  Administered 2018-08-01: 50 ug via INTRAVENOUS
  Filled 2018-08-01: qty 2

## 2018-08-01 MED ORDER — INSULIN DETEMIR 100 UNIT/ML ~~LOC~~ SOLN
12.0000 [IU] | Freq: Two times a day (BID) | SUBCUTANEOUS | Status: DC
  Administered 2018-08-01: 12 [IU] via SUBCUTANEOUS
  Filled 2018-08-01 (×2): qty 0.12

## 2018-08-01 MED ORDER — FOLIC ACID 1 MG PO TABS
1.0000 mg | ORAL_TABLET | Freq: Every day | ORAL | Status: DC
  Administered 2018-08-01 – 2018-08-07 (×7): 1 mg
  Filled 2018-08-01 (×7): qty 1

## 2018-08-01 MED ORDER — VITAL HIGH PROTEIN PO LIQD: 1000.0000 mL | ORAL | Status: DC

## 2018-08-01 MED ORDER — VITAL HIGH PROTEIN PO LIQD
1000.0000 mL | ORAL | Status: DC
  Administered 2018-08-02 – 2018-08-06 (×8): 1000 mL
  Filled 2018-08-01: qty 1000

## 2018-08-01 MED ORDER — POTASSIUM CHLORIDE 10 MEQ/50ML IV SOLN
10.0000 meq | INTRAVENOUS | Status: AC
  Administered 2018-08-01 (×3): 10 meq via INTRAVENOUS
  Filled 2018-08-01 (×2): qty 50

## 2018-08-01 MED ORDER — SODIUM CHLORIDE 0.9 % IV SOLN
INTRAVENOUS | Status: DC | PRN
  Administered 2018-08-01: 1000 mL via INTRAVENOUS
  Administered 2018-08-01: 500 mL via INTRAVENOUS
  Administered 2018-08-03: 10:00:00 via INTRAVENOUS

## 2018-08-01 MED ORDER — PRO-STAT SUGAR FREE PO LIQD
30.0000 mL | Freq: Two times a day (BID) | ORAL | Status: DC
  Administered 2018-08-02 – 2018-08-07 (×11): 30 mL
  Filled 2018-08-01 (×12): qty 30

## 2018-08-01 MED ORDER — PNEUMOCOCCAL VAC POLYVALENT 25 MCG/0.5ML IJ INJ: 0.5000 mL | INJECTION | INTRAMUSCULAR | Status: DC | PRN

## 2018-08-01 MED ORDER — FAMOTIDINE IN NACL 20-0.9 MG/50ML-% IV SOLN
20.0000 mg | INTRAVENOUS | Status: DC
  Administered 2018-08-01 – 2018-08-03 (×3): 20 mg via INTRAVENOUS
  Filled 2018-08-01 (×2): qty 50

## 2018-08-01 MED ORDER — CHLORHEXIDINE GLUCONATE CLOTH 2 % EX PADS: 6.0000 | MEDICATED_PAD | Freq: Every day | CUTANEOUS | Status: DC

## 2018-08-01 MED ORDER — INSULIN ASPART 100 UNIT/ML ~~LOC~~ SOLN: 4.0000 [IU] | SUBCUTANEOUS | Status: DC

## 2018-08-01 MED ORDER — INSULIN DETEMIR 100 UNIT/ML ~~LOC~~ SOLN
12.0000 [IU] | Freq: Two times a day (BID) | SUBCUTANEOUS | Status: DC
  Filled 2018-08-01: qty 0.12

## 2018-08-01 MED ORDER — INFLUENZA VAC SPLIT QUAD 0.5 ML IM SUSY: 0.5000 mL | PREFILLED_SYRINGE | INTRAMUSCULAR | Status: DC | PRN

## 2018-08-01 MED ORDER — POTASSIUM CHLORIDE 20 MEQ/15ML (10%) PO SOLN
40.0000 meq | Freq: Once | ORAL | Status: AC
  Administered 2018-08-01: 40 meq
  Filled 2018-08-01: qty 30

## 2018-08-01 MED ORDER — SODIUM CHLORIDE 0.9 % IV SOLN
INTRAVENOUS | Status: DC
  Administered 2018-08-01 – 2018-08-04 (×3): via INTRAVENOUS

## 2018-08-01 MED ORDER — INSULIN DETEMIR 100 UNIT/ML ~~LOC~~ SOLN: 12.0000 [IU] | Freq: Two times a day (BID) | SUBCUTANEOUS | Status: DC

## 2018-08-01 MED FILL — Medication: Qty: 1 | Status: AC

## 2018-08-01 NOTE — Progress Notes (Signed)
Inpatient Diabetes Program Recommendations  AACE/ADA: New Consensus Statement on Inpatient Glycemic Control (2019)  Target Ranges:  Prepandial:   less than 140 mg/dL      Peak postprandial:   less than 180 mg/dL (1-2 hours)      Critically ill patients:  140 - 180 mg/dL   Results for Ruben Harmon, Ruben Harmon (MRN [id number]) as of 08/01/2018 15:20  Ref. Range 08/01/2018 08:11 08/01/2018 09:47 08/01/2018 11:05 08/01/2018 12:02 08/01/2018 13:20  Glucose-Capillary Latest Ref Range: 70 - 99 mg/dL 409 (H) 811 (H) 914 (H) 223 (H) 185 (H)  Results for Ruben Harmon, Ruben Harmon (MRN [id number]) as of 08/01/2018 15:20  Ref. Range 08/17/2018 22:03  Beta-Hydroxybutyric Acid Latest Ref Range: 0.05 - 0.27 mmol/L 5.58 (H)  Glucose Latest Ref Range: 70 - 99 mg/dL 0,865 (HH)   Review of Glycemic Control  DM history: DM60 (dx at 57 years old; makes NO insulin and will require basal, correction, and meal coverage insulin) Outpatient Diabetes medications: Medtronic insulin pump with Novolog Current orders for Inpatient glycemic control: IV insulin drip per DKA   Inpatient Diabetes Program Recommendations:  Insulin - Basal: Once MD is ready to transition off IV insulin drip, please consider ordering Lantus 20 units Q24H (based on 100 kg x 0.2 units). Correction (SSI): Once MD is ready to transition off IV insulin drip, please consider ordering CBGs with Novolog 0-9 units Q4H. HbgA1C: Please consider ordering an A1C to evaluate glycemic control over the past 2-3 months.  NOTE: Noted consult for Diabetes Coordinator. Patient has DM hx and uses an insulin pump for DM control outpatient. Patient was found unresponsive in hotel room and noted to be in DKA with initial glucose of 1242 mg/dl on 78/4/69. Patient is currently on ventilator and not able to provide any information regarding DM control or insulin pump. Medtronic MiniMed Paradigm at bedside (no insulin reservoir or tubing attached).  Diabetes Coordinator called [phone] [employer] number  to assist with obtaining insulin pump settings despite not having an insulin reservoir in place. Able to get to insulin pump settings which are as follows:  Basal  12A 1.20 units/hour 6:30A 1.3 units/hour 6:30P 1.0 units/hour Total basal: 28.9 units/ 24 hours  NO insulin sensitivity or carb ratio in bolus settings in insulin pump.  Returned insulin pump back to patient's room; patient's nurse Denny Peon, RN and patient's wife are present in room at this time. Patient's wife states they have been separated but she was able to provide some very general information. She stated patient has DM42 (dx at 57 years old) and he has been on an insulin pump for about 4 years. She states that patient had A1C in 8% range in Feb 2019. She states that over the past several months, his DM control has not been as good.  Discussed patient is currently on insulin drip but will be transitioned to SQ insulin when MD feels appropriate. Inquired if wife wanted to take patient's insulin pump with her and she asked that it be kept at the hospital. Asked RN to have security lock up insulin pump so it is not misplaced. If patient has no insulin pump supplies here at the hospital in his belongings, he will need to use SQ insulin injections while inpatient for glycemic control. Discussed SQ insulin recommendations with Denny Peon, RN.  Will continue to follow.  Thanks, Orlando Penner, RN, MSN, CDE Diabetes Coordinator Inpatient Diabetes Program [phone] (Team Pager from 8am to 5pm)

## 2018-08-01 NOTE — Progress Notes (Signed)
EEG completed, results pending. 

## 2018-08-01 NOTE — Progress Notes (Signed)
Initial Nutrition Assessment  DOCUMENTATION CODES:   Obesity unspecified  INTERVENTION:   -Vital High Protein @ 50 ml/hr (1200 ml) via OGT -30 ml Prostat BID  Provides: 1400 kcals, 135 grams protein, 1008 ml free water. Meets 100% of needs.   NUTRITION DIAGNOSIS:   Inadequate oral intake related to inability to eat as evidenced by NPO status.  GOAL:   Patient will meet greater than or equal to 90% of their needs  MONITOR:   Diet advancement, Vent status, Labs, Weight trends, TF tolerance, I & O's  REASON FOR ASSESSMENT:   Consult Enteral/tube feeding initiation and management  ASSESSMENT:   Patient with PMH significant for tobacco abuse, cocaine abuse (recently left rehab 11/2), DM (insulin pump?) and HTN. Presents this admission after being found unresponsive in a hotel room. Admitted for asystole cardiac arrest with concern for hypoxic/anoxic injury and DKA.    Pt on TTM and requiring very low water temps to maintain 36C. Off pressors at this time. No family at bedside to provide nutrition history. Minimal weight history, suspect 100 kg is a stated weight. RD attempted to get bed weight, but is unsure if bed was zeroed out. Will base needs off 100 kg and adjust for accurate weight once obtained.   Patient is currently intubated on ventilator support MV: 13.2 L/min Temp (24hrs), Avg:95.8 F (35.4 C), Min:92.8 F (33.8 C), Max:97.5 F (36.4 C) BP: 131/62 MAP: 81  I/O: +6.4 L since admit UOP: 1575 ml x 24 hrs OGT: 50 ml x 24 hrs  Medications reviewed and include: folic acid, thiamine, NS @ 75 ml/hr, insulin gtt, fentanyl Labs reviewed: CBG 524-776 Mg 2.5 (L)   NUTRITION - FOCUSED PHYSICAL EXAM:    Most Recent Value  Orbital Region  No depletion  Upper Arm Region  Unable to assess  Thoracic and Lumbar Region  Unable to assess  Buccal Region  No depletion  Temple Region  No depletion  Clavicle Bone Region  No depletion  Clavicle and Acromion Bone Region  No  depletion  Scapular Bone Region  Unable to assess  Dorsal Hand  No depletion  Patellar Region  No depletion  Anterior Thigh Region  Unable to assess  Posterior Calf Region  No depletion  Edema (RD Assessment)  None     Diet Order:   Diet Order            Diet NPO time specified  Diet effective now              EDUCATION NEEDS:   Not appropriate for education at this time  Skin:  Skin Assessment: Reviewed RN Assessment  Last BM:  PTA  Height:   Ht Readings from Last 1 Encounters:  08/27/18 5' 6.5" (1.689 m)    Weight:   Wt Readings from Last 1 Encounters:  2018/08/27 100 kg    Ideal Body Weight:  67.3 kg  BMI:  Body mass index is 35.05 kg/m.  Estimated Nutritional Needs:   Kcal:  1100-1400 kcal  Protein:  135-150 grams  Fluid:  >/= 1.5 L/day   Vanessa Kick RD, LDN Clinical Nutrition Pager # - [phone]

## 2018-08-01 NOTE — Procedures (Signed)
ELECTROENCEPHALOGRAM REPORT   Patient: Ruben Harmon       Room #: 1Y78G  Age: 57 y.o.        Sex: male Referring Physician: Marcos Eke Report Date:  08/01/2018        Interpreting Physician: Thana Farr  History: [name] is an 57 y.o. male s/p arrest  Medications:  Insulin, Versed, Fentanyl, Vancomycin, Zosyn, Folvite, Pepcid  Conditions of Recording:  This is a 21 channel routine scalp EEG performed with bipolar and monopolar montages arranged in accordance to the international 10/20 system of electrode placement. One channel was dedicated to EKG recording.  The patient is in the intubated and sedated state.  Description:  Artifact is prominent during the recording often obscuring the background rhythm. When able to be visualized the background is slow and poorly organized.   It consists of a low voltage, polymorphic delta rhythm that is diffusely distributed and continuous.  There is occasional intermixed poorly organized theta activity as well.   Although the patient is stimulated multiple times during the recording due to the artifact it is difficult to determine if there is some reactivity of the background rhythm. No epileptiform activity is noted.   Hyperventilation and intermittent photic stimulation were not performed.   IMPRESSION: This is an abnormal EEG secondary to general background slowing.  This finding may be seen with a diffuse disturbance that is etiologically nonspecific, but may include a metabolic encephalopathy or medication effect, among other possibilities.  No epileptiform activity was noted.     Thana Farr, MD Neurology [phone] 08/01/2018, 1:06 PM

## 2018-08-01 NOTE — Progress Notes (Signed)
  Echocardiogram 2D Echocardiogram has been performed.  Ruben Harmon 08/01/2018, 8:49 AM

## 2018-08-01 NOTE — H&P (Addendum)
NAME:  Ruben Harmon, MRN:  [id number], DOB:  [birth year], LOS: 1 ADMISSION DATE:  08/18/2018, CONSULTATION DATE:  11/3 REFERRING MD:  Otho Najjar, CHIEF COMPLAINT:  Cardiac Arrest    History of Present Illness   57 year old male presents to ED after being found unresponsive in hotel room. Patient was recently in Rehab (for cocaine) and left on 11/2. When EMS arrived patient was hypoxic and hypotensive with "high" glucose. On arrival to ED patient had a witnessed 10 minute asystolic arrest. Intubated and started on EPI. Glucose 1200, K 7.9, LA 14.33. Patient did have an insulin pump in place. Post arrest patient remained unresponsive. PCCM asked to admit.   Wife contact via phone. States that she is an NP in [place] and her husband has battled polysubstance abuse for years and recently was placed in Rehab. Was suppose to be d/c 11/2 however no one could find him. His friends called the wife today and told her they had been doing drugs and that the patient would not wake up.   Past Medical History  Tobacco Use, Cocaine Use, HTN    Significant Hospital Events   11/3 > Presents to ED   Consults: date of consult/date signed off & final recs:  PCCM 11/3   Procedures (surgical and bedside):  ETT 11/3 >> Right Femoral CVC 11/3>> Right Femoral Aline 11/3 >>   Significant Diagnostic Tests:  CXR 11/3 > Cardiac shadow is within normal limits. Endotracheal tube and nasogastric catheter are noted in satisfactory position. The lungs are clear. No bony abnormality is seen. No pneumothorax is noted. CT Head/C-Spine 11/4 >> CT Chest/A 11/4>> ECHO 11/4 >> EEG 11/4 >>   Micro Data:  Blood 11/3 >> Urine 11/3 >> Tracheal Asp 11/3 >>   Antimicrobials:  Zosyn 11/4 >> Vancomycin 11/4 >>    Subjective:    Objective   Blood pressure (!) 147/61, pulse 90, temperature (!) 92.8 F (33.8 C), temperature source Bladder, resp. rate (!) 23, height 5' 6.5" (1.689 m), weight 100 kg, SpO2 100 %.    Vent  Mode: PRVC FiO2 (%):  [60 %-100 %] 60 % Set Rate:  [15 bmp-30 bmp] 24 bmp Vt Set:  [510 mL] 510 mL PEEP:  [5 cmH20-12 cmH20] 5 cmH20 Plateau Pressure:  [25 cmH20-27 cmH20] 25 cmH20  No intake or output data in the 24 hours ending 08/01/18 0022 Filed Weights   Aug 18, 2018 2033  Weight: 100 kg    Examination: General: Adult male, on vent  HENT: Dry MM, Dry brown secretions noted around mouth  Lungs: Exp Wheeze, no crackles  Cardiovascular: Irregular, no MRG Abdomen: Distended, active bowel sounds  Extremities: -edema  Neuro: pupils 2, non-reactive, does not respond to physical/verbal stimulation, no cough/gag GU: foley in place   Resolved Hospital Problem list     Assessment & Plan:   Respiratory Insufficieny s/p Cardiac Arrest  Concern for Aspiration Event  H/O Tobacco Use Plan Plan  -Vent Support -Trend ABG/CXR -Duoneb/PRN Albuterol  -Pulmonary Hygiene  -VAP bundle   Asystole Cardiac Arrest (Witnessed 10 minutes) Septic vs Hypovolemic Shock  H/O HTN Plan -Cardiac Monitoring  -Hypothermia (36) Protocol  -Maintain MAP >75 -Currently on Vasopressin/Levophed  -Trend Troponin  -ECHO pending    Severe Anion Gap Metabolic Acidosis with Lactic Acidosis  Acute Kidney Injury  Pseudo Hypokalemia in setting of DKA Hyponatremia Plan  -Trend BMP -Trend LA  -Bicarb gttt @ 125 ml/hr (Received 6 amps bicarb in ED) -To receive a total of 6L  bolus   SUP Plan  -NPO -OG to LWS -Pepcid   DKA -Beta-H 5.58  H/O DM Plan  -Trend Glucose  -Insulin gtt per DKA protocol   Leukocytosis ?Aspiration Event  -noted brown secretions around and in mouth Plan  -Trend WBC and Fever Curve -Trend PCT  -Follow Culture Data -Zosyn/Vancomycin     Encephalopathy, Metabolic vs Anoxic Injury  H/O Cocaine Use Plan  -EEG -CT Head/C-Spine pending -If Neuro status remains poor consider MRI in 24 hours  -Folic Acid/Thiamine (Presumed ETOH) -ETOH/Asipirin/Tylenol levels pending    Disposition / Summary of Today's Plan 08/01/18   Undergoing hypothermia protocol. Follow imaging and labs. Possible MRI in next 24 hours if no improvement in neuro status.       Diet: NPO VAP protocol DVT prophylaxis: Heparin  GI prophylaxis: Pepcid  Hyperglycemia protocol Mobility: Bedrest Code Status: FC Family Communication: Wife updated on phone   Labs   CBC: Recent Labs  Lab 08/14/2018 1949 08/03/2018 1957  WBC 34.9*  --   NEUTROABS 29.7*  --   HGB 11.6* 13.3  HCT 42.2 39.0  MCV 111.9*  --   PLT 298  --     Basic Metabolic Panel: Recent Labs  Lab 08/03/2018 1957 08/20/2018 2203  NA 114* 125*  K 7.9* 5.0  CL 85* 84*  CO2  --  9*  GLUCOSE >700* 1,242*  BUN 83* 70*  CREATININE 3.30* 3.99*  CALCIUM  --  7.8*  MG  --  3.0*  PHOS  --  12.0*   GFR: Estimated Creatinine Clearance: 22.8 mL/min (A) (by C-G formula based on SCr of 3.99 mg/dL (H)). Recent Labs  Lab 08/05/2018 1949 08/23/2018 1957 08/05/2018 2203  WBC 34.9*  --   --   LATICACIDVEN  --  14.33* 12.0*    Liver Function Tests: Recent Labs  Lab 08/02/2018 2203  AST 159*  ALT 90*  ALKPHOS 86  BILITOT 1.3*  PROT 4.6*  ALBUMIN 2.7*   Recent Labs  Lab 08/14/2018 2203  LIPASE 20  AMYLASE 19*   No results for input(s): AMMONIA in the last 168 hours.  ABG    Component Value Date/Time   PHART 7.337 (L) 08/01/2018 0006   PCO2ART 29.4 (L) 08/01/2018 0006   PO2ART 75.0 (L) 08/01/2018 0006   HCO3 16.4 (L) 08/01/2018 0006   TCO2 17 (L) 08/01/2018 0006   ACIDBASEDEF 9.0 (H) 08/01/2018 0006   O2SAT 96.0 08/01/2018 0006     Coagulation Profile: Recent Labs  Lab 08/26/2018 1949  INR 1.56    Cardiac Enzymes: Recent Labs  Lab 08/24/2018 2203  TROPONINI 0.23*    HbA1C: No results found for: HGBA1C  CBG: Recent Labs  Lab 08/06/2018 1935 08/06/2018 2131 08/09/2018 2233 08/15/2018 2338  GLUCAP >600* >600* >600* >600*    Review of Systems:   Unable to review as patient is intubated/unresponsive    Past Medical History  He,  has no past medical history on file.   Surgical History   History reviewed. No pertinent surgical history.   Social History   Social History   Socioeconomic History  . Marital status: Married    Spouse name: Not on file  . Number of children: Not on file  . Years of education: Not on file  . Highest education level: Not on file  Occupational History  . Not on file  Social Needs  . Financial resource strain: Not on file  . Food insecurity:    Worry: Not on file  Inability: Not on file  . Transportation needs:    Medical: Not on file    Non-medical: Not on file  Tobacco Use  . Smoking status: Current Every Day Smoker  . Smokeless tobacco: Never Used  Substance and Sexual Activity  . Alcohol use: Yes  . Drug use: Yes    Types: Cocaine  . Sexual activity: Not on file  Lifestyle  . Physical activity:    Days per week: Not on file    Minutes per session: Not on file  . Stress: Not on file  Relationships  . Social connections:    Talks on phone: Not on file    Gets together: Not on file    Attends religious service: Not on file    Active member of club or organization: Not on file    Attends meetings of clubs or organizations: Not on file    Relationship status: Not on file  . Intimate partner violence:    Fear of current or ex partner: Not on file    Emotionally abused: Not on file    Physically abused: Not on file    Forced sexual activity: Not on file  Other Topics Concern  . Not on file  Social History Narrative  . Not on file  ,  reports that he has been smoking. He has never used smokeless tobacco. He reports that he drinks alcohol. He reports that he has current or past drug history. Drug: Cocaine.   Family History   His family history is not on file.   Allergies No Known Allergies   Home Medications  Prior to Admission medications   Medication Sig Start Date End Date Taking? Authorizing Provider   amoxicillin-clavulanate (AUGMENTIN) 875-125 MG tablet Take 1 tablet by mouth 2 (two) times daily.   Yes [provider]  buPROPion (WELLBUTRIN XL) 300 MG 24 hr tablet Take 300 mg by mouth daily.   Yes [provider]  cariprazine (VRAYLAR) capsule Take by mouth.   Yes [provider]  desvenlafaxine (PRISTIQ) 100 MG 24 hr tablet Take 100 mg by mouth daily.   Yes [provider]  fluticasone (FLONASE) 50 MCG/ACT nasal spray Place 1 spray into both nostrils daily.   Yes [provider]  Gabapentin Enacarbil 600 MG TBCR Take 1,200 mg by mouth at bedtime.    Yes [provider]  insulin aspart (NOVOLOG) 100 UNIT/ML injection Inject into the skin 3 (three) times daily before meals.   Yes [provider]  Melatonin 5 MG SUBL Place 5 mg under the tongue at bedtime as needed (insomnia).   Yes [provider]  Omega-3 Fatty Acids (FISH OIL) 1000 MG CAPS Take by mouth.   Yes [provider]  Pramipexole Dihydrochloride 4.5 MG TB24 Take 4.5 mg by mouth at bedtime.   Yes [provider]  rosuvastatin (CRESTOR) 40 MG tablet Take 40 mg by mouth daily.   Yes [provider]     Critical care time: 85 minutes     [name], AGACNP-BC [facility]  PCCM Pgr: [phone]

## 2018-08-01 NOTE — Progress Notes (Signed)
NAME:  Ruben Harmon, MRN:  [id number], DOB:  1961/07/27, LOS: 1 ADMISSION DATE:  07/30/2018, CONSULTATION DATE:  11/3 REFERRING MD:  Otho Najjar, CHIEF COMPLAINT:  Cardiac Arrest    History of Present Illness   57 year old male with history of drug abuse recently out of rehab found down 11/3 and suffered 10 minute asystolic arrest. Started on TTM 36 and admitted to ICU.   Past Medical History  Tobacco Use, Cocaine Use, HTN    Significant Hospital Events   11/3 > Presents to ED  11/4 off pressors, starting to respond some. On 36TTM requiring very low water temps to maintain.    Consults: date of consult/date signed off & final recs:  PCCM 11/3   Procedures (surgical and bedside):  ETT 11/3 > Right Femoral CVC 11/3 >   Right Femoral Aline 11/3 >   Significant Diagnostic Tests:  CXR 11/3 > Cardiac shadow is within normal limits. Endotracheal tube and nasogastric catheter are noted in satisfactory position. The lungs are clear. No bony abnormality is seen. No pneumothorax is noted. CT Head/C-Spine 11/4 > No acute intracranial abnormalities.  Fluid in the maxillary sinuses. No fracture or traumatic malalignment in the cervical spine. Mild ground-glass in the left lung apex is likely infectious or inflammatory. CT Chest/A 11/4> Patchy airspace infiltrates throughout both lungs with consolidation in the lung bases. This could represent edema, pneumonia, aspiration, or contusion. No acute process demonstrated in the abdomen or pelvis. Diffuse fatty infiltration of the liver. Enlarged prostate gland. Echo 11/4 >> EEG 11/4 > >  Micro Data:  Blood 11/3 >> Urine 11/3 >> Tracheal Asp 11/3 >>   Antimicrobials:  Zosyn 11/4 >> Vancomycin 11/4 >>    Subjective/Interval:  Now off pressors. Remains on TTM on vent. Starting to have some minimal response to verbal. Follow very basic commands per RN. Needing low water temps to maintain 36C.   Objective   Blood pressure (!) 125/55, pulse 96,  temperature (!) 97.5 F (36.4 C), temperature source Bladder, resp. rate (!) 32, height 5' 6.5" (1.689 m), weight 100 kg, SpO2 100 %. CVP:  [18 mmHg-20 mmHg] 20 mmHg  Vent Mode: PRVC FiO2 (%):  [40 %-100 %] 40 % Set Rate:  [15 bmp-30 bmp] 26 bmp Vt Set:  [510 mL] 510 mL PEEP:  [5 cmH20-12 cmH20] 5 cmH20 Plateau Pressure:  [20 cmH20-27 cmH20] 20 cmH20   Intake/Output Summary (Last 24 hours) at 08/01/2018 0905 Last data filed at 08/01/2018 0600 Gross per 24 hour  Intake 8001.1 ml  Output 1625 ml  Net 6376.1 ml   Filed Weights   08/27/2018 2033  Weight: 100 kg    Examination:  General: Adult middle aged male, on vent  HENT: [place]/AT ETT. Brown secretions in suction tubing. PERRl.  Lungs: Coarse rhonchi  Cardiovascular: regular rate, rhythm, no MRG Abdomen: soft, non-distended. Hypoactive  Extremities: -edema  Neuro: Arouses to verbal despite low dose sedation. Was able to nod and squeeze hands of RN.  GU: foley in place   Resolved Hospital Problem list     Assessment & Plan:   Acute hypercarbic respiratory failure s/p cardiac arrest.  - Vent Support - Trend ABG/CXR - Pulmonary Hygiene  - VAP bundle  - Vent synchrony poor. Reasonable to increase sedation while on TTM.   Asystole Cardiac Arrest (Witnessed 10 minutes) with concern for hypoxic/anoxic injury. Etiology is suspected to be drug induced given history and witness reports. Troponin is creeping up. EKG so far with no  evidence of ischemia. Requiring low water temps (12C) to maintain normothermia indicating underlying fever.  - Telemetry monitoring in ICU - TTM 36 - CT head negative - EEG pending - Consider MRI - Cannot add tylenol or ibuprofen considering hepatic and renal dysfunction. Will need to repeat LFT and apply bear hugger.  - Reasonable to DC TTM as he is waking up some, but underlying temp is such that we likely need to continue if we wish to avoid fever.   Septic vs Hypovolemic Shock: improved -Levophed off  now, DC vasopressin.  -Continue to trend troponin  -Echo pending    Severe Anion Gap Metabolic Acidosis with Lactic Acidosis +/- DKA : improving - ABG improved, DC bicarb infusion - Follow BMP - Continue insulin infusion, reassess gap once bicarb off. - Hourly glucose check  - Lactic trending down, no need to check further  Acute Kidney Injury  - Gentle hydration - Follow BMP  Hypokalemia - Supplement potassium  Probable aspiration pneumonia: Leukocytosis and likely aspiration event  -Trend WBC and Fever Curve -Trend PCT  - Follow Culture Data - Zosyn/Vancomycin     Substance abuse H/O Tobacco Use - Cessation counseling when able.  - See neurologic plan above - Thiamine, folate.   Disposition / Summary of Today's Plan 08/01/18   In ICU on vent and pressors s/p cardiac arrest. Undergoing TTM 36C.     Diet: NPO  VAP protocol DVT prophylaxis: Heparin  GI prophylaxis: Pepcid  Hyperglycemia protocol Mobility: Bedrest Code Status: FC Family Communication: Wife updated on phone and will be present today.   Labs   CBC: Recent Labs  Lab Aug 28, 2018 1949 08-28-18 1957  WBC 34.9*  --   NEUTROABS 29.7*  --   HGB 11.6* 13.3  HCT 42.2 39.0  MCV 111.9*  --   PLT 298  --     Basic Metabolic Panel: Recent Labs  Lab 28-Aug-2018 1957 08/28/2018 2203 Aug 28, 2018 2329 08/01/18 0500 08/01/18 0807  NA 114* 125* 129* 135 139  K 7.9* 5.0 3.6 3.2* 3.6  CL 85* 84* 92* 98 100  CO2  --  9* 16* 26 26  GLUCOSE >700* 1,242* 1,101* 776* 524*  BUN 83* 70* 67* 64* 65*  CREATININE 3.30* 3.99* 3.62* 3.12* 3.07*  CALCIUM  --  7.8* 7.2* 7.2* 7.3*  MG  --  3.0*  --   --   --   PHOS  --  12.0*  --   --   --    GFR: Estimated Creatinine Clearance: 29.7 mL/min (A) (by C-G formula based on SCr of 3.07 mg/dL (H)). Recent Labs  Lab August 28, 2018 1949 08-28-18 1957 2018/08/28 2203 2018/08/28 2329 08/01/18 0443  PROCALCITON  --   --  22.99  --   --   WBC 34.9*  --   --   --   --   LATICACIDVEN  --   14.33* 12.0* 7.7* 4.0*    Liver Function Tests: Recent Labs  Lab 2018/08/28 2203  AST 159*  ALT 90*  ALKPHOS 86  BILITOT 1.3*  PROT 4.6*  ALBUMIN 2.7*   Recent Labs  Lab 08/28/18 2203  LIPASE 20  AMYLASE 19*   No results for input(s): AMMONIA in the last 168 hours.  ABG    Component Value Date/Time   PHART 7.345 (L) 08/01/2018 0442   PCO2ART 47.3 08/01/2018 0442   PO2ART 116.0 (H) 08/01/2018 0442   HCO3 25.9 08/01/2018 0442   TCO2 27 08/01/2018 0442   ACIDBASEDEF 9.0 (  H) 08/01/2018 0006   O2SAT 98.0 08/01/2018 0442     Coagulation Profile: Recent Labs  Lab 2018/08/16 1949  INR 1.56    Cardiac Enzymes: Recent Labs  Lab Aug 16, 2018 2203 2018/08/16 2329  TROPONINI 0.23* 0.39*    HbA1C: No results found for: HGBA1C  CBG: Recent Labs  Lab 08/01/18 0112 08/01/18 0207 08/01/18 0302 08/01/18 0451 08/01/18 0658  GLUCAP >600* >600* >600* >600* 548*   ADMITTING HPI 57 year old male presents to ED after being found unresponsive in hotel room. Patient was recently in Rehab (for cocaine) and left on 11/2. When EMS arrived patient was hypoxic and hypotensive with "high" glucose. On arrival to ED patient had a witnessed 10 minute asystolic arrest. Intubated and started on EPI. Glucose 1200, K 7.9, LA 14.33. Patient did have an insulin pump in place. Post arrest patient remained unresponsive. PCCM asked to admit.   Wife contact via phone. States that she is an NP in [place] and her husband has battled polysubstance abuse for years and recently was placed in Rehab. Was suppose to be d/c 11/2 however no one could find him. His friends called the wife today and told her they had been doing drugs and that the patient would not wake up.   Review of Systems:   Unable to review as patient is intubated/unresponsive   Past Medical History  He,  has no past medical history on file.   Surgical History   History reviewed. No pertinent surgical history.   Social History    Social History   Socioeconomic History  . Marital status: Married    Spouse name: Not on file  . Number of children: Not on file  . Years of education: Not on file  . Highest education level: Not on file  Occupational History  . Not on file  Social Needs  . Financial resource strain: Not on file  . Food insecurity:    Worry: Not on file    Inability: Not on file  . Transportation needs:    Medical: Not on file    Non-medical: Not on file  Tobacco Use  . Smoking status: Current Every Day Smoker  . Smokeless tobacco: Never Used  Substance and Sexual Activity  . Alcohol use: Yes  . Drug use: Yes    Types: Cocaine  . Sexual activity: Not on file  Lifestyle  . Physical activity:    Days per week: Not on file    Minutes per session: Not on file  . Stress: Not on file  Relationships  . Social connections:    Talks on phone: Not on file    Gets together: Not on file    Attends religious service: Not on file    Active member of club or organization: Not on file    Attends meetings of clubs or organizations: Not on file    Relationship status: Not on file  . Intimate partner violence:    Fear of current or ex partner: Not on file    Emotionally abused: Not on file    Physically abused: Not on file    Forced sexual activity: Not on file  Other Topics Concern  . Not on file  Social History Narrative  . Not on file  ,  reports that he has been smoking. He has never used smokeless tobacco. He reports that he drinks alcohol. He reports that he has current or past drug history. Drug: Cocaine.   Family History   His  family history is not on file.   Allergies No Known Allergies   Home Medications  Prior to Admission medications   Medication Sig Start Date End Date Taking? Authorizing Provider  amoxicillin-clavulanate (AUGMENTIN) 875-125 MG tablet Take 1 tablet by mouth 2 (two) times daily.   Yes [provider]  buPROPion (WELLBUTRIN XL) 300 MG 24 hr tablet Take  300 mg by mouth daily.   Yes [provider]  cariprazine (VRAYLAR) capsule Take by mouth.   Yes [provider]  desvenlafaxine (PRISTIQ) 100 MG 24 hr tablet Take 100 mg by mouth daily.   Yes [provider]  fluticasone (FLONASE) 50 MCG/ACT nasal spray Place 1 spray into both nostrils daily.   Yes [provider]  Gabapentin Enacarbil 600 MG TBCR Take 1,200 mg by mouth at bedtime.    Yes [provider]  insulin aspart (NOVOLOG) 100 UNIT/ML injection Inject into the skin 3 (three) times daily before meals.   Yes [provider]  Melatonin 5 MG SUBL Place 5 mg under the tongue at bedtime as needed (insomnia).   Yes [provider]  Omega-3 Fatty Acids (FISH OIL) 1000 MG CAPS Take by mouth.   Yes [provider]  Pramipexole Dihydrochloride 4.5 MG TB24 Take 4.5 mg by mouth at bedtime.   Yes [provider]  rosuvastatin (CRESTOR) 40 MG tablet Take 40 mg by mouth daily.   Yes [provider]     Critical care time: [address]      Joneen Roach, AGACNP-BC [facility] Pager [phone] or [phone]  08/01/2018 9:37 AM

## 2018-08-02 ENCOUNTER — Inpatient Hospital Stay (HOSPITAL_COMMUNITY): Payer: BLUE CROSS/BLUE SHIELD

## 2018-08-02 DIAGNOSIS — G9340 Encephalopathy, unspecified: Secondary | ICD-10-CM

## 2018-08-02 LAB — TROPONIN I
TROPONIN I: 1.49 ng/mL — AB (ref <0.03)
Troponin I: 1.37 ng/mL (ref <0.03)
Troponin I: 1.14 ng/mL (ref <0.03)

## 2018-08-02 LAB — BASIC METABOLIC PANEL
ANION GAP: 9 (ref 5–15)
Anion gap: 11 (ref 5–15)
Anion gap: 10 (ref 5–15)
Anion gap: 9 (ref 5–15)
BUN: 59 mg/dL — AB (ref 6–20)
BUN: 56 mg/dL — AB (ref 6–20)
BUN: 57 mg/dL — AB (ref 6–20)
BUN: 61 mg/dL — AB (ref 6–20)
CALCIUM: 7.8 mg/dL — AB (ref 8.9–10.3)
CO2: 27 mmol/L (ref 22–32)
CO2: 26 mmol/L (ref 22–32)
CO2: 26 mmol/L (ref 22–32)
CO2: 29 mmol/L (ref 22–32)
CREATININE: 2.5 mg/dL — AB (ref 0.61–1.24)
Calcium: 7.7 mg/dL — ABNORMAL LOW (ref 8.9–10.3)
Calcium: 7.8 mg/dL — ABNORMAL LOW (ref 8.9–10.3)
Calcium: 7.7 mg/dL — ABNORMAL LOW (ref 8.9–10.3)
Chloride: 108 mmol/L (ref 98–111)
Chloride: 108 mmol/L (ref 98–111)
Chloride: 108 mmol/L (ref 98–111)
Chloride: 108 mmol/L (ref 98–111)
Creatinine, Ser: 2.69 mg/dL — ABNORMAL HIGH (ref 0.61–1.24)
Creatinine, Ser: 2.5 mg/dL — ABNORMAL HIGH (ref 0.61–1.24)
Creatinine, Ser: 2.5 mg/dL — ABNORMAL HIGH (ref 0.61–1.24)
GFR calc Af Amer: 31 mL/min — ABNORMAL LOW (ref >60)
GFR calc Af Amer: 31 mL/min — ABNORMAL LOW (ref >60)
GFR calc Af Amer: 31 mL/min — ABNORMAL LOW (ref >60)
GFR, EST AFRICAN AMERICAN: 29 mL/min — AB (ref >60)
GFR, EST NON AFRICAN AMERICAN: 25 mL/min — AB (ref >60)
GFR, EST NON AFRICAN AMERICAN: 27 mL/min — AB (ref >60)
GFR, EST NON AFRICAN AMERICAN: 27 mL/min — AB (ref >60)
GFR, EST NON AFRICAN AMERICAN: 27 mL/min — AB (ref >60)
GLUCOSE: 175 mg/dL — AB (ref 70–99)
GLUCOSE: 146 mg/dL — AB (ref 70–99)
GLUCOSE: 115 mg/dL — AB (ref 70–99)
Glucose, Bld: 99 mg/dL (ref 70–99)
POTASSIUM: 3.8 mmol/L (ref 3.5–5.1)
POTASSIUM: 4.2 mmol/L (ref 3.5–5.1)
POTASSIUM: 4.2 mmol/L (ref 3.5–5.1)
POTASSIUM: 3.8 mmol/L (ref 3.5–5.1)
SODIUM: 144 mmol/L (ref 135–145)
Sodium: 145 mmol/L (ref 135–145)
Sodium: 144 mmol/L (ref 135–145)
Sodium: 146 mmol/L — ABNORMAL HIGH (ref 135–145)

## 2018-08-02 LAB — GLUCOSE, CAPILLARY
GLUCOSE-CAPILLARY: 96 mg/dL (ref 70–99)
Glucose-Capillary: 176 mg/dL — ABNORMAL HIGH (ref 70–99)
Glucose-Capillary: 106 mg/dL — ABNORMAL HIGH (ref 70–99)
Glucose-Capillary: 239 mg/dL — ABNORMAL HIGH (ref 70–99)
Glucose-Capillary: 92 mg/dL (ref 70–99)
Glucose-Capillary: 81 mg/dL (ref 70–99)

## 2018-08-02 LAB — PHOSPHORUS: PHOSPHORUS: 3.3 mg/dL (ref 2.5–4.6)

## 2018-08-02 LAB — CBC
HEMATOCRIT: 36.3 % — AB (ref 39.0–52.0)
HEMOGLOBIN: 12.1 g/dL — AB (ref 13.0–17.0)
MCH: 29.3 pg (ref 26.0–34.0)
MCHC: 33.3 g/dL (ref 30.0–36.0)
MCV: 87.9 fL (ref 80.0–100.0)
NRBC: 0 % (ref 0.0–0.2)
Platelets: 210 10*3/uL (ref 150–400)
RBC: 4.13 MIL/uL — ABNORMAL LOW (ref 4.22–5.81)
RDW: 13.7 % (ref 11.5–15.5)
WBC: 22.6 10*3/uL — AB (ref 4.0–10.5)

## 2018-08-02 LAB — HEMOGLOBIN A1C
Hgb A1c MFr Bld: 8.8 % — ABNORMAL HIGH (ref 4.8–5.6)
Mean Plasma Glucose: 205.86 mg/dL

## 2018-08-02 LAB — MAGNESIUM: Magnesium: 2.3 mg/dL (ref 1.7–2.4)

## 2018-08-02 MED ORDER — FENTANYL CITRATE (PF) 100 MCG/2ML IJ SOLN
100.0000 ug | INTRAMUSCULAR | Status: DC | PRN
  Administered 2018-08-02 – 2018-08-07 (×12): 100 ug via INTRAVENOUS
  Filled 2018-08-02 (×12): qty 2

## 2018-08-02 MED ORDER — SODIUM CHLORIDE 0.9 % IV BOLUS
1000.0000 mL | Freq: Once | INTRAVENOUS | Status: AC
  Administered 2018-08-02: 1000 mL via INTRAVENOUS

## 2018-08-02 MED ORDER — DEXTROSE 50 % IV SOLN
50.0000 mL | Freq: Once | INTRAVENOUS | Status: AC
  Administered 2018-08-02: 50 mL via INTRAVENOUS

## 2018-08-02 MED ORDER — HYDRALAZINE HCL 20 MG/ML IJ SOLN
10.0000 mg | INTRAMUSCULAR | Status: DC | PRN
  Administered 2018-08-02: 10 mg via INTRAVENOUS
  Filled 2018-08-02: qty 1

## 2018-08-02 MED ORDER — DEXTROSE 10 % IV SOLN
INTRAVENOUS | Status: DC
  Administered 2018-08-02: 23:00:00 via INTRAVENOUS

## 2018-08-02 MED ORDER — DEXTROSE 50 % IV SOLN
INTRAVENOUS | Status: AC
  Administered 2018-08-02: 21:00:00
  Filled 2018-08-02: qty 50

## 2018-08-02 MED ORDER — INSULIN GLARGINE 100 UNIT/ML ~~LOC~~ SOLN
10.0000 [IU] | Freq: Every day | SUBCUTANEOUS | Status: DC
  Filled 2018-08-02: qty 0.1

## 2018-08-02 MED ORDER — FENTANYL CITRATE (PF) 100 MCG/2ML IJ SOLN
100.0000 ug | INTRAMUSCULAR | Status: AC | PRN
  Administered 2018-08-03 (×3): 100 ug via INTRAVENOUS
  Filled 2018-08-02 (×6): qty 2

## 2018-08-02 MED ORDER — DEXMEDETOMIDINE HCL IN NACL 400 MCG/100ML IV SOLN
0.0000 ug/kg/h | INTRAVENOUS | Status: DC
  Administered 2018-08-02 (×2): 0.5 ug/kg/h via INTRAVENOUS
  Administered 2018-08-02: 1 ug/kg/h via INTRAVENOUS
  Administered 2018-08-03 (×3): 1.2 ug/kg/h via INTRAVENOUS
  Filled 2018-08-02 (×5): qty 100

## 2018-08-02 MED ORDER — HYDRALAZINE HCL 20 MG/ML IJ SOLN
10.0000 mg | INTRAMUSCULAR | Status: DC | PRN
  Administered 2018-08-06: 20 mg via INTRAVENOUS
  Filled 2018-08-02: qty 1

## 2018-08-02 MED ORDER — INSULIN ASPART 100 UNIT/ML ~~LOC~~ SOLN
0.0000 [IU] | SUBCUTANEOUS | Status: DC
  Administered 2018-08-02: 3 [IU] via SUBCUTANEOUS
  Administered 2018-08-03: 7 [IU] via SUBCUTANEOUS
  Administered 2018-08-03: 2 [IU] via SUBCUTANEOUS
  Administered 2018-08-03 (×3): 5 [IU] via SUBCUTANEOUS
  Administered 2018-08-03 – 2018-08-04 (×2): 7 [IU] via SUBCUTANEOUS
  Administered 2018-08-04: 5 [IU] via SUBCUTANEOUS

## 2018-08-02 NOTE — Progress Notes (Signed)
Patient's BP 70's/40's.  Started Levophed at 2217.  Spoke with Dr. Arsenio Loader regarding issues with blood glucose level and steps that were initiated.  Orders for Dextrose 10 and a NSS bolus was initiated as ordered.  Titrating Levophed gtt according to protocol.  Monitoring.

## 2018-08-02 NOTE — Progress Notes (Signed)
Sputum collected and sent to lab 

## 2018-08-02 NOTE — Significant Event (Signed)
A car key fob is given to patient's sister Hortencia Pilar) at 1600 at the requests of the sister and the patient's spouse. Patient's spouse called earlier and gave permission for staff to let patient's sister French Ana take the key fob with her. Patient's sister does not want to take the rest of the belongings that is locked up.    Val Schiavo

## 2018-08-02 NOTE — Progress Notes (Signed)
Inpatient Diabetes Program Recommendations  AACE/ADA: New Consensus Statement on Inpatient Glycemic Control (2019)  Target Ranges:  Prepandial:   less than 140 mg/dL      Peak postprandial:   less than 180 mg/dL (1-2 hours)      Critically ill patients:  140 - 180 mg/dL   Results for Ruben Harmon, Ruben Harmon (MRN [id number]) as of 08/02/2018 14:24  Ref. 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 Please consider increasing Lantus to 15 units QHS. Insulin-Tube Feeding Coverage: Patient will likely need Novolog tube feeding coverage. Would recommend starting with Novolog 2 units Q4H for tube feeding coverage. HbgA1C: Please consider ordering an A1C to evaluate glycemic control over the past 2-3 months.  NOTE: Noted patient was transitioned from IV to SQ insulin on 08/01/18. Received Lantus 12 units at 18:56 on 08/01/18 and has gotten 15 units of Novolog correction since then. Current CBG for 12pm today not reflected in chart but noted on MAR that RN gave Novolog 3 units at 12:38 today for glucose of 239 mg/dl.   Thanks, Orlando Penner, RN, MSN, CDE Diabetes Coordinator Inpatient Diabetes Program [phone] (Team Pager from 8am to 5pm)

## 2018-08-02 NOTE — Significant Event (Signed)
MRI has been called again and spoken with staff there, who stated they will call unit when they are ready for patient.     Ruben Harmon

## 2018-08-02 NOTE — Progress Notes (Signed)
Patient became extremely agitated and trying to flip out of bed.  Increased Precedex but not responding, given Versed as charted.  Patient biting ET tube, assisted respiratory in placing a bite block now that patient was less aggressive.  Will continue to titrate precedex accordingly.

## 2018-08-02 NOTE — Progress Notes (Addendum)
NAME:  Ruben Harmon, MRN:  [id number], DOB:  1961-08-12, LOS: 2 ADMISSION DATE:  08/24/2018, CONSULTATION DATE:  11/3 REFERRING MD:  Otho Najjar, CHIEF COMPLAINT:  Cardiac Arrest    History of Present Illness   57 year old male with history of drug abuse recently out of rehab found down 11/3 and suffered 10 minute asystolic arrest. Started on TTM 36 and admitted to ICU.   Past Medical History  Tobacco Use, Cocaine Use, HTN    Significant Hospital Events   11/3 > Presents to ED  11/4 off pressors, starting to respond some. On 36TTM requiring very low water temps to maintain.    Consults: date of consult/date signed off & final recs:  PCCM 11/3   Procedures (surgical and bedside):  ETT 11/3 > Right Femoral CVC 11/3 >   Right Femoral Aline 11/3 >   Significant Diagnostic Tests:  CXR 11/3 > Cardiac shadow is within normal limits. Endotracheal tube and nasogastric catheter are noted in satisfactory position. The lungs are clear. No bony abnormality is seen. No pneumothorax is noted. CT Head/C-Spine 11/4 > No acute intracranial abnormalities.  Fluid in the maxillary sinuses. No fracture or traumatic malalignment in the cervical spine. Mild ground-glass in the left lung apex is likely infectious or inflammatory. CT Chest/A 11/4> Patchy airspace infiltrates throughout both lungs with consolidation in the lung bases. This could represent edema, pneumonia, aspiration, or contusion. No acute process demonstrated in the abdomen or pelvis. Diffuse fatty infiltration of the liver. Enlarged prostate gland. Echo 11/4 >> LVEF 65-70%. Normal wall motion. Grade 1 DD.  EEG 11/4 > > abnormal, background slowing. Notably, the patient was on hefty doses of sedation  At the time of exam. No epileptiform activity noted.   Micro Data:  Blood 11/3 >> Tracheal Asp 11/3 >>   Antimicrobials:  Zosyn 11/4 >> Vancomycin 11/4 >>    Subjective/Interval:  Remains off pressors. TTM transitioned to avoid fevers. WBC  improving. Agitated and rigid off sedation with no meaningful response. EEG yesterday neg for seizures  Objective   Blood pressure (!) 166/71, pulse (!) 115, temperature 98.6 F (37 C), resp. rate (!) 26, height 5' 6.5" (1.689 m), weight 83.9 kg, SpO2 100 %. CVP:  [8 mmHg-16 mmHg] 15 mmHg  Vent Mode: PRVC FiO2 (%):  [40 %] 40 % Set Rate:  [26 bmp] 26 bmp Vt Set:  [510 mL] 510 mL PEEP:  [5 cmH20] 5 cmH20 Plateau Pressure:  [18 cmH20-27 cmH20] 27 cmH20   Intake/Output Summary (Last 24 hours) at 08/02/2018 0831 Last data filed at 08/02/2018 0800 Gross per 24 hour  Intake 1879.71 ml  Output 945 ml  Net 934.71 ml   Filed Weights   07/30/2018 2033 08/02/18 0434  Weight: 100 kg 83.9 kg    Examination:  General:  Adult middle aged male in NAD on vent Neuro:  Eyes open to verbal, no meaningful response. Agitated and rigid intermittently.  HEENT:  [place]/AT, No JVD noted, PERRL Cardiovascular:  RRR, no MRG Lungs:  Coarse bilateral breath sounds. Currently weaning well 5/5 PSV Abdomen:  Soft, non-distended Musculoskeletal:  No acute deformity Skin:  Intact, MMM  Resolved Hospital Problem list   Anion gap acidosis, lactic acidosis/DKA  Assessment & Plan:   Acute hypercarbic respiratory failure s/p cardiac arrest.  - Vent Support, SBT today.  - CXR in the morning.   - VAP bundle   Asystolic Cardiac Arrest (Witnessed 10 minutes) with concern for hypoxic/anoxic injury. Etiology is suspected to  be drug induced given history and witness reports. Troponin is creeping up. EKG so far with no evidence of ischemia. EEG and CT head negative.  - Telemetry monitoring in ICU - Avoiding fevers - MRI brain today.  - Continue to trend troponin. If continues to trend may need to start heparin infusion and consult cardiology.  - Precedex for RASS 0 to -1. PRN fentanyl.   Septic vs Hypovolemic Shock: improved -Levophed off now, DC vasopressin.  -Continue to trend troponin  -Echo pending    DM1  managed on insulin pump at home, but recent compliance has been poor. Has been seem by DM coordinator 11/4.  - DM coordinator recommends 20 units lantus, will start lower because he is just starting TF today. (start 10 units) - CBG q 4 - SSI  Acute Kidney Injury: mild improvement - Gentle hydration - Follow BMP  Hypokalemia - Supplement potassium  Probable aspiration pneumonia: Leukocytosis and likely aspiration event  - Trend WBC and Fever Curve - Trend PCT  - Follow Culture Data - Continue zosyn - Getting second dose of vancomycin today, if nothing changes by tomorrow would DC.   Substance abuse H/O Tobacco Use - Cessation counseling when able.  - See neurologic plan above - Thiamine, folate.   Disposition / Summary of Today's Plan 08/02/18   In ICU s/p cardiac arrest. S/p TTM 36. Not waking up. Will transition to Precedex to promote weaning. MRI brain today. Slow to wake up.     Diet: TF VAP protocol DVT prophylaxis: Heparin  GI prophylaxis: Pepcid  Hyperglycemia protocol Mobility: Bedrest Code Status: FC Family Communication: Wife updated on phone and will be present today.   Labs   CBC: Recent Labs  Lab 08/26/2018 1949 07/29/2018 1957 08/02/18 0353  WBC 34.9*  --  22.6*  NEUTROABS 29.7*  --   --   HGB 11.6* 13.3 12.1*  HCT 42.2 39.0 36.3*  MCV 111.9*  --  87.9  PLT 298  --  210    Basic Metabolic Panel: Recent Labs  Lab 08/10/2018 2203  08/01/18 0500 08/01/18 0807 08/01/18 0943 08/01/18 1323 08/01/18 1945 08/02/18 0353  NA 125*   < > 135 139  --  142 143 144  K 5.0   < > 3.2* 3.6  --  3.7 4.1 3.8  CL 84*   < > 98 100  --  104 104 108  CO2 9*   < > 26 26  --  27 25 27   GLUCOSE 1,242*   < > 776* 524*  --  177* 160* 99  BUN 70*   < > 64* 65*  --  65* 63* 59*  CREATININE 3.99*   < > 3.12* 3.07*  --  2.85* 2.97* 2.69*  CALCIUM 7.8*   < > 7.2* 7.3*  --  7.6* 7.9* 7.7*  MG 3.0*  --   --   --  2.5* 2.3 2.5* 2.3  PHOS 12.0*  --   --   --  4.2 3.5 3.6 3.3     < > = values in this interval not displayed.   GFR: Estimated Creatinine Clearance: 31.1 mL/min (A) (by C-G formula based on SCr of 2.69 mg/dL (H)). Recent Labs  Lab 07/30/2018 1949 07/30/2018 1957 08/02/2018 2203 08/08/2018 2329 08/01/18 0443 08/02/18 0353  PROCALCITON  --   --  22.99  --   --   --   WBC 34.9*  --   --   --   --  22.6*  LATICACIDVEN  --  14.33* 12.0* 7.7* 4.0*  --     Liver Function Tests: Recent Labs  Lab 2018/08/06 2203  AST 159*  ALT 90*  ALKPHOS 86  BILITOT 1.3*  PROT 4.6*  ALBUMIN 2.7*   Recent Labs  Lab 08-06-2018 2203  LIPASE 20  AMYLASE 19*   No results for input(s): AMMONIA in the last 168 hours.  ABG    Component Value Date/Time   PHART 7.345 (L) 08/01/2018 0442   PCO2ART 47.3 08/01/2018 0442   PO2ART 116.0 (H) 08/01/2018 0442   HCO3 25.9 08/01/2018 0442   TCO2 27 08/01/2018 0442   ACIDBASEDEF 9.0 (H) 08/01/2018 0006   O2SAT 98.0 08/01/2018 0442     Coagulation Profile: Recent Labs  Lab 08/06/18 1949  INR 1.56    Cardiac Enzymes: Recent Labs  Lab 08/06/18 2203 2018-08-06 2329 08/01/18 0807 08/01/18 1323  TROPONINI 0.23* 0.39* 2.56* 2.61*    HbA1C: No results found for: HGBA1C  CBG: Recent Labs  Lab 08/01/18 1819 08/01/18 1900 08/01/18 2055 08/01/18 2320 08/02/18 0356  GLUCAP 128* 136* 195* 176* 106*   ADMITTING HPI 57 year old male presents to ED after being found unresponsive in hotel room. Patient was recently in Rehab (for cocaine) and left on 11/2. When EMS arrived patient was hypoxic and hypotensive with "high" glucose. On arrival to ED patient had a witnessed 10 minute asystolic arrest. Intubated and started on EPI. Glucose 1200, K 7.9, LA 14.33. Patient did have an insulin pump in place. Post arrest patient remained unresponsive. PCCM asked to admit.   Wife contact via phone. States that she is an NP in [place] and her husband has battled polysubstance abuse for years and recently was placed in Rehab. Was  suppose to be d/c 11/2 however no one could find him. His friends called the wife today and told her they had been doing drugs and that the patient would not wake up.   Review of Systems:   Unable to review as patient is intubated/unresponsive   Past Medical History  He,  has no past medical history on file.   Surgical History   History reviewed. No pertinent surgical history.   Social History   Social History   Socioeconomic History  . Marital status: Married    Spouse name: Not on file  . Number of children: Not on file  . Years of education: Not on file  . Highest education level: Not on file  Occupational History  . Not on file  Social Needs  . Financial resource strain: Not on file  . Food insecurity:    Worry: Not on file    Inability: Not on file  . Transportation needs:    Medical: Not on file    Non-medical: Not on file  Tobacco Use  . Smoking status: Current Every Day Smoker  . Smokeless tobacco: Never Used  Substance and Sexual Activity  . Alcohol use: Yes  . Drug use: Yes    Types: Cocaine  . Sexual activity: Not on file  Lifestyle  . Physical activity:    Days per week: Not on file    Minutes per session: Not on file  . Stress: Not on file  Relationships  . Social connections:    Talks on phone: Not on file    Gets together: Not on file    Attends religious service: Not on file    Active member of club or organization: Not on file  Attends meetings of clubs or organizations: Not on file    Relationship status: Not on file  . Intimate partner violence:    Fear of current or ex partner: Not on file    Emotionally abused: Not on file    Physically abused: Not on file    Forced sexual activity: Not on file  Other Topics Concern  . Not on file  Social History Narrative  . Not on file  ,  reports that he has been smoking. He has never used smokeless tobacco. He reports that he drinks alcohol. He reports that he has current or past drug history.  Drug: Cocaine.   Family History   His family history is not on file.   Allergies No Known Allergies   Home Medications  Prior to Admission medications   Medication Sig Start Date End Date Taking? Authorizing Provider  amoxicillin-clavulanate (AUGMENTIN) 875-125 MG tablet Take 1 tablet by mouth 2 (two) times daily.   Yes [provider]  buPROPion (WELLBUTRIN XL) 300 MG 24 hr tablet Take 300 mg by mouth daily.   Yes [provider]  cariprazine (VRAYLAR) capsule Take by mouth.   Yes [provider]  desvenlafaxine (PRISTIQ) 100 MG 24 hr tablet Take 100 mg by mouth daily.   Yes [provider]  fluticasone (FLONASE) 50 MCG/ACT nasal spray Place 1 spray into both nostrils daily.   Yes [provider]  Gabapentin Enacarbil 600 MG TBCR Take 1,200 mg by mouth at bedtime.    Yes [provider]  insulin aspart (NOVOLOG) 100 UNIT/ML injection Inject into the skin 3 (three) times daily before meals.   Yes [provider]  Melatonin 5 MG SUBL Place 5 mg under the tongue at bedtime as needed (insomnia).   Yes [provider]  Omega-3 Fatty Acids (FISH OIL) 1000 MG CAPS Take by mouth.   Yes [provider]  Pramipexole Dihydrochloride 4.5 MG TB24 Take 4.5 mg by mouth at bedtime.   Yes [provider]  rosuvastatin (CRESTOR) 40 MG tablet Take 40 mg by mouth daily.   Yes [provider]     Critical care time: [address]      Joneen Roach, AGACNP-BC [facility] Pulmonary/Critical Care Pager [phone] or [phone]  08/02/2018 8:31 AM

## 2018-08-02 NOTE — Progress Notes (Addendum)
eLink Physician-Brief Progress Note Patient Name: Ruben Harmon DOB: [birth year] MRN: [id number]   Date of Service  08/02/2018  HPI/Events of Note  Hypoglycemia - Blood glucose = 23. Already given D50 by bedside nurse.  eICU Interventions  Will order: 1. D10W to run IV at 50 mL/hour.     Intervention Category Major Interventions: Other:  Lenell Antu 08/02/2018, 10:15 PM

## 2018-08-02 NOTE — Progress Notes (Signed)
Hypoglycemic Event  CBG: 23  Treatment: D50 50 ml  Symptoms: routine CBG check  Follow-up CBG: Time:2058 CBG Result:81  Possible Reasons for Event:  unknown Comments/MD notified:  Awaiting orders    Ruben Harmon [name]

## 2018-08-02 NOTE — Progress Notes (Signed)
Bite Block placed due to patient biting ETT. Unable to pass suction cather.

## 2018-08-02 NOTE — Significant Event (Signed)
Noted foley with increased sediments and trends of low urinary output this afternoon. RN flushed foley and met resistance-foley sealed broke off when attempting to flush; unable to flush at all. RN bladder scanned and received > 900cc in bladder.   RN removed existing foley and replaced with a new foley catheter with another RN Gabe. Received return of clear, yellow urine, 750cc so far.       Ruben Harmon

## 2018-08-03 ENCOUNTER — Inpatient Hospital Stay (HOSPITAL_COMMUNITY): Payer: BLUE CROSS/BLUE SHIELD

## 2018-08-03 DIAGNOSIS — N179 Acute kidney failure, unspecified: Secondary | ICD-10-CM

## 2018-08-03 LAB — TRIGLYCERIDES: TRIGLYCERIDES: 108 mg/dL (ref <150)

## 2018-08-03 LAB — BASIC METABOLIC PANEL
ANION GAP: 9 (ref 5–15)
BUN: 61 mg/dL — ABNORMAL HIGH (ref 6–20)
CHLORIDE: 109 mmol/L (ref 98–111)
CO2: 27 mmol/L (ref 22–32)
Calcium: 7.6 mg/dL — ABNORMAL LOW (ref 8.9–10.3)
Creatinine, Ser: 2.4 mg/dL — ABNORMAL HIGH (ref 0.61–1.24)
GFR calc non Af Amer: 28 mL/min — ABNORMAL LOW (ref >60)
GFR, EST AFRICAN AMERICAN: 33 mL/min — AB (ref >60)
Glucose, Bld: 212 mg/dL — ABNORMAL HIGH (ref 70–99)
POTASSIUM: 3.8 mmol/L (ref 3.5–5.1)
Sodium: 145 mmol/L (ref 135–145)

## 2018-08-03 LAB — GLUCOSE, CAPILLARY
GLUCOSE-CAPILLARY: 267 mg/dL — AB (ref 70–99)
GLUCOSE-CAPILLARY: 275 mg/dL — AB (ref 70–99)
GLUCOSE-CAPILLARY: 316 mg/dL — AB (ref 70–99)
GLUCOSE-CAPILLARY: 327 mg/dL — AB (ref 70–99)
Glucose-Capillary: 282 mg/dL — ABNORMAL HIGH (ref 70–99)
Glucose-Capillary: 200 mg/dL — ABNORMAL HIGH (ref 70–99)
Glucose-Capillary: 23 mg/dL — CL (ref 70–99)
Glucose-Capillary: 97 mg/dL (ref 70–99)
Glucose-Capillary: 300 mg/dL — ABNORMAL HIGH (ref 70–99)

## 2018-08-03 LAB — COMPREHENSIVE METABOLIC PANEL
ALBUMIN: 2.4 g/dL — AB (ref 3.5–5.0)
ALK PHOS: 195 U/L — AB (ref 38–126)
ALT: 161 U/L — AB (ref 0–44)
AST: 415 U/L — AB (ref 15–41)
Anion gap: 9 (ref 5–15)
BILIRUBIN TOTAL: 1.4 mg/dL — AB (ref 0.3–1.2)
BUN: 61 mg/dL — AB (ref 6–20)
CO2: 25 mmol/L (ref 22–32)
Calcium: 7.4 mg/dL — ABNORMAL LOW (ref 8.9–10.3)
Chloride: 108 mmol/L (ref 98–111)
Creatinine, Ser: 2.31 mg/dL — ABNORMAL HIGH (ref 0.61–1.24)
GFR calc Af Amer: 34 mL/min — ABNORMAL LOW (ref >60)
GFR calc non Af Amer: 30 mL/min — ABNORMAL LOW (ref >60)
GLUCOSE: 351 mg/dL — AB (ref 70–99)
Potassium: 4 mmol/L (ref 3.5–5.1)
Sodium: 142 mmol/L (ref 135–145)
Total Protein: 4.8 g/dL — ABNORMAL LOW (ref 6.5–8.1)

## 2018-08-03 LAB — CBC
HEMATOCRIT: 32.2 % — AB (ref 39.0–52.0)
Hemoglobin: 10.3 g/dL — ABNORMAL LOW (ref 13.0–17.0)
MCH: 29.9 pg (ref 26.0–34.0)
MCHC: 32 g/dL (ref 30.0–36.0)
MCV: 93.3 fL (ref 80.0–100.0)
Platelets: 136 10*3/uL — ABNORMAL LOW (ref 150–400)
RBC: 3.45 MIL/uL — AB (ref 4.22–5.81)
RDW: 14.4 % (ref 11.5–15.5)
WBC: 16.3 10*3/uL — ABNORMAL HIGH (ref 4.0–10.5)
nRBC: 0 % (ref 0.0–0.2)

## 2018-08-03 MED ORDER — PROPOFOL 1000 MG/100ML IV EMUL
0.0000 ug/kg/min | INTRAVENOUS | Status: DC
  Administered 2018-08-03: 50 ug/kg/min via INTRAVENOUS
  Administered 2018-08-03: 10 ug/kg/min via INTRAVENOUS
  Administered 2018-08-03 – 2018-08-05 (×9): 50 ug/kg/min via INTRAVENOUS
  Administered 2018-08-05: 40 ug/kg/min via INTRAVENOUS
  Administered 2018-08-05: 25 ug/kg/min via INTRAVENOUS
  Administered 2018-08-05: 30 ug/kg/min via INTRAVENOUS
  Administered 2018-08-06: 25 ug/kg/min via INTRAVENOUS
  Administered 2018-08-06: 50 ug/kg/min via INTRAVENOUS
  Administered 2018-08-06: 40 ug/kg/min via INTRAVENOUS
  Administered 2018-08-06: 50 ug/kg/min via INTRAVENOUS
  Administered 2018-08-07 (×3): 40 ug/kg/min via INTRAVENOUS
  Filled 2018-08-03 (×22): qty 100

## 2018-08-03 MED ORDER — PROPOFOL 500 MG/50ML IV EMUL
0.0000 ug/kg/min | INTRAVENOUS | Status: DC
  Filled 2018-08-03: qty 50

## 2018-08-03 MED ORDER — INSULIN GLARGINE 100 UNIT/ML ~~LOC~~ SOLN
10.0000 [IU] | Freq: Every day | SUBCUTANEOUS | Status: DC
  Administered 2018-08-03: 10 [IU] via SUBCUTANEOUS
  Filled 2018-08-03: qty 0.1

## 2018-08-03 NOTE — Progress Notes (Signed)
eLink Physician-Brief Progress Note Patient Name: Ruben Harmon DOB: 07-29-1961 MRN: [id number]   Date of Service  08/03/2018  HPI/Events of Note  Blood glucose = 351.  eICU Interventions  Will decrease D10W to 10 mL/hour.      Intervention Category Major Interventions: Hyperglycemia - active titration of insulin therapy  Wetzel Meester [place] 08/03/2018, 7:04 AM

## 2018-08-03 NOTE — Progress Notes (Signed)
Patient became very agitated and combative.  Called for assistance from [employer] nurse, Jodi Geralds.  Patient given versed and fentanyl as charted.  Patient does not follow commands, appears angry, and purposely swinging at staff, kicking and trying to grab through mitts.  Versed and fentanyl effective at this time.

## 2018-08-03 NOTE — Progress Notes (Signed)
Wasted 150 mls of IV fentanyl (28mcg/ml concentration) with Otto Herb, RN.  Leanna Battles, RN

## 2018-08-03 NOTE — Progress Notes (Addendum)
NAME:  Ruben Harmon, MRN:  [id number], DOB:  [birth year], LOS: 3 ADMISSION DATE:  August 16, 2018, CONSULTATION DATE:  11/3 REFERRING MD:  Otho Najjar, CHIEF COMPLAINT:  Cardiac Arrest    History of Present Illness   57 year old male with history of drug abuse recently out of rehab found down 11/3 and suffered 10 minute asystolic arrest. Started on TTM 36 and admitted to ICU.   Past Medical History  Tobacco Use, Cocaine Use, HTN    Significant Hospital Events   11/3 > Presents to ED  11/4 off pressors, starting to respond some. On 36TTM requiring very low water temps to maintain.    Consults: date of consult/date signed off & final recs:  PCCM 11/3   Procedures (surgical and bedside):  ETT 11/3 > Right Femoral CVC 11/3 >   Right Femoral Aline 11/3 > out  Significant Diagnostic Tests:  CXR 11/3 > Cardiac shadow is within normal limits. Endotracheal tube and nasogastric catheter are noted in satisfactory position. The lungs are clear. No bony abnormality is seen. No pneumothorax is noted. CT Head/C-Spine 11/4 > No acute intracranial abnormalities.  Fluid in the maxillary sinuses. No fracture or traumatic malalignment in the cervical spine. Mild ground-glass in the left lung apex is likely infectious or inflammatory. CT Chest/A 11/4> Patchy airspace infiltrates throughout both lungs with consolidation in the lung bases. This could represent edema, pneumonia, aspiration, or contusion. No acute process demonstrated in the abdomen or pelvis. Diffuse fatty infiltration of the liver. Enlarged prostate gland. Echo 11/4 >> LVEF 65-70%. Normal wall motion. Grade 1 DD.  EEG 11/4 > > abnormal, background slowing. Notably, the patient was on hefty doses of sedation  At the time of exam. No epileptiform activity noted.   MRI brain >>  Micro Data:  Blood 11/3 >> Tracheal Asp 11/3 >>   Antimicrobials:  Zosyn 11/4 >> Vancomycin 11/4 >>  11/4  Subjective/Interval:  Remains on low dose levophed at 4  mcg/min, after 1L bolus overnight Remains on precedex 1.2 Per RN, became agitated, climbing out of bed and swinging- was not able to be redirected or f/c- now sedated after fentanyl/ versed  Hypoglycemic overnight, lantus held and started on D10 gtt  Objective   Blood pressure (!) 115/55, pulse 75, temperature 98.4 F (36.9 C), temperature source Bladder, resp. rate (!) 26, height 5' 6.5" (1.689 m), weight 83.7 kg, SpO2 100 %. CVP:  [7 mmHg-28 mmHg] 14 mmHg  Vent Mode: PRVC FiO2 (%):  [40 %] 40 % Set Rate:  [26 bmp] 26 bmp Vt Set:  [510 mL] 510 mL PEEP:  [5 cmH20] 5 cmH20 Plateau Pressure:  [16 cmH20-26 cmH20] 25 cmH20   Intake/Output Summary (Last 24 hours) at 08/03/2018 0856 Last data filed at 08/03/2018 0800 Gross per 24 hour  Intake 3315.63 ml  Output 1995 ml  Net 1320.63 ml   Filed Weights   August 16, 2018 2033 08/02/18 0434 08/03/18 0428  Weight: 100 kg 83.9 kg 83.7 kg    Examination:  General:  Critically ill adult male sedate on MV in NAD HEENT: MM pink/moist, pupils 3/reactive, minimal scleral edema, anicteric,  ETT/ OGT Neuro:  Currently sedated, does not f/c CV: SR 80, no murmur, +1-2 pulses PULM: even/non-labored on PSV 5/5, lungs bilaterally coarse GI: soft, bs active  Extremities: warm/dry, no edema  Skin: no rashes   Resolved Hospital Problem list   Anion gap acidosis, lactic acidosis/DKA  Assessment & Plan:   Acute hypercarbic respiratory failure s/p cardiac arrest.  -  CXR reviewed- will retract ETT 1 cm -  full MV support with daily SBT - currently weaning well at 5/5, however mental status prevents extubation at this point - trend CXR - VAP bundle   ADDENDUM 0953- unprovoked 2 hours after fentanyl/versed, agitated with wake eyes open with straight/ fixed upperward gaze, becoming diaphoretic and red in the face, does not blink to threat, MAE 5/5 strength- questionably wiggled toes but not consistent and would not follow other commands.  Additionally  tachypneic into the 40's- placed back on full support.  Will d/c precedex and start propofol and get MRI today hopefully   Asystolic Cardiac Arrest (Witnessed 10 minutes) with concern for hypoxic/anoxic injury. Etiology is suspected to be drug induced given history and witness reports. Troponin peaked 11/4 at 2.61. EKG with no evidence of ischemia. EEG and CT head negative.  - Telemetry monitoring in ICU - Avoiding fevers- normothermia  - MRI brain ordered- when able (requires max 2 infusions) - minimize sedation as able - Precedex for RASS 0 to -1. PRN fentanyl.   Septic vs Hypovolemic Shock vs sedation needs : ongoing, UOP remains adequate, CVP 12 - Levophed for MAP > 65 - trend UOP/ strict I/O's   DM1 managed on insulin pump at home, but recent compliance has been poor. Has been seem by DM coordinator 11/4.  - d/c D10 gtt - lantus 10 units now (held overnight due to hypoglycemic episode) - continue TF at goal  - CBG q 4 - SSI sensitive  Acute Kidney Injury: stable UOP / sCr - Trend BMP / urinary output  Hypokalemia Resolved, monitor  Probable aspiration pneumonia:  improving leukocytosis, although weaning well on 5/5 - Trend WBC and Fever Curve - PCT in am  - following blood and sputum cx  - Continue zosyn   Substance abuse H/O Tobacco Use - daily Thiamine, folate.   Transaminitis, mild elevation from prior labs on 11/3 - repeat LFTs in am   Disposition / Summary of Today's Plan 08/03/18   Weaning well but mental status prevents extubation.  Sedated prior to exam.  Will have to wean sedation and assess mental status    Diet: TF at goal  VAP protocol DVT prophylaxis: Heparin sq GI prophylaxis: Pepcid  Hyperglycemia protocol: as above Mobility: Bedrest Code Status: FC Family Communication: no family at bedside.   Labs   CBC: Recent Labs  Lab Aug 16, 2018 1949 16-Aug-2018 1957 08/02/18 0353 08/03/18 0458  WBC 34.9*  --  22.6* 16.3*  NEUTROABS 29.7*  --   --   --    HGB 11.6* 13.3 12.1* 10.3*  HCT 42.2 39.0 36.3* 32.2*  MCV 111.9*  --  87.9 93.3  PLT 298  --  210 136*    Basic Metabolic Panel: Recent Labs  Lab 2018/08/16 2203  08/01/18 0943 08/01/18 1323 08/01/18 1945 08/02/18 0353 08/02/18 0931 08/02/18 1331 08/02/18 2131 08/03/18 0115 08/03/18 0458  NA 125*   < >  --  142 143 144 145 144 146* 145 142  K 5.0   < >  --  3.7 4.1 3.8 4.2 4.2 3.8 3.8 4.0  CL 84*   < >  --  104 104 108 108 108 108 109 108  CO2 9*   < >  --  27 25 27 26 26 29 27 25   GLUCOSE 1,242*   < >  --  177* 160* 99 175* 146* 115* 212* 351*  BUN 70*   < >  --  65* 63* 59* 56* 57* 61* 61* 61*  CREATININE 3.99*   < >  --  2.85* 2.97* 2.69* 2.50* 2.50* 2.50* 2.40* 2.31*  CALCIUM 7.8*   < >  --  7.6* 7.9* 7.7* 7.8* 7.8* 7.7* 7.6* 7.4*  MG 3.0*  --  2.5* 2.3 2.5* 2.3  --   --   --   --   --   PHOS 12.0*  --  4.2 3.5 3.6 3.3  --   --   --   --   --    < > = values in this interval not displayed.   GFR: Estimated Creatinine Clearance: 36.2 mL/min (A) (by C-G formula based on SCr of 2.31 mg/dL (H)). Recent Labs  Lab Aug 27, 2018 1949 27-Aug-2018 1957 27-Aug-2018 2203 2018/08/27 2329 08/01/18 0443 08/02/18 0353 08/03/18 0458  PROCALCITON  --   --  22.99  --   --   --   --   WBC 34.9*  --   --   --   --  22.6* 16.3*  LATICACIDVEN  --  14.33* 12.0* 7.7* 4.0*  --   --     Liver Function Tests: Recent Labs  Lab 08-27-2018 2203 08/03/18 0458  AST 159* 415*  ALT 90* 161*  ALKPHOS 86 195*  BILITOT 1.3* 1.4*  PROT 4.6* 4.8*  ALBUMIN 2.7* 2.4*   Recent Labs  Lab 2018-08-27 2203  LIPASE 20  AMYLASE 19*   No results for input(s): AMMONIA in the last 168 hours.  ABG    Component Value Date/Time   PHART 7.345 (L) 08/01/2018 0442   PCO2ART 47.3 08/01/2018 0442   PO2ART 116.0 (H) 08/01/2018 0442   HCO3 25.9 08/01/2018 0442   TCO2 27 08/01/2018 0442   ACIDBASEDEF 9.0 (H) 08/01/2018 0006   O2SAT 98.0 08/01/2018 0442     Coagulation Profile: Recent Labs  Lab August 27, 2018 1949    INR 1.56    Cardiac Enzymes: Recent Labs  Lab 08/01/18 0807 08/01/18 1323 08/02/18 0843 08/02/18 1443 08/02/18 2043  TROPONINI 2.56* 2.61* 1.49* 1.37* 1.14*    HbA1C: Hgb A1c MFr Bld  Date/Time Value Ref Range Status  08/02/2018 08:43 AM 8.8 (H) 4.8 - 5.6 % Final    Comment:    (NOTE) Pre diabetes:          5.7%-6.4% Diabetes:              >6.4% Glycemic control for   <7.0% adults with diabetes     CBG: Recent Labs  Lab 08/02/18 1545 08/02/18 2056 08/03/18 0349 08/03/18 0703 08/03/18 0805  GLUCAP 92 81 282* 316* 267*     Critical care time: 40 mins     Posey Boyer, AGACNP-BC [place] [facility] Pgr: [phone] or if no answer [phone] 08/03/2018, 9:20 AM

## 2018-08-03 NOTE — Progress Notes (Signed)
Called to see if pt was able to come to MRI, RN sts pt has been combative and agitated.  Also pt has 4 IV lines running and currently in the MRI department we only have 1 IV pump that has 2 channels.  Will have to wait until pt becomes more stable and can be reduce to 2 IV lines.

## 2018-08-03 NOTE — Progress Notes (Signed)
Patient with cooling pads on to maintain temperature. Patient noted to be increasingly agitated, BP/HR elevated, and patient shivering. Water temp on arctic sun noted to be 70 degrees F. Dr. Vassie Loll notified who stated to keep arctic sun pads in place, but to turn off the machine and monitor temperature. Core temp 99.9. Given prn tylenol per CCM orders. Propofol drip increased for patient comfort.   Leanna Battles, RN

## 2018-08-03 NOTE — Progress Notes (Signed)
ETT 25 cm at lip. Tube retracted 1 cm per Dr. Vassie Loll. ETT secured 24 cm at lip.

## 2018-08-03 NOTE — Progress Notes (Signed)
Inpatient Diabetes Program Recommendations  AACE/ADA: New Consensus Statement on Inpatient Glycemic Control (2019)  Target Ranges:  Prepandial:   less than 140 mg/dL      Peak postprandial:   less than 180 mg/dL (1-2 hours)      Critically ill patients:  140 - 180 mg/dL   Results for TERRACE, FONTANILLA (MRN [id number]) as of 08/03/2018 10:31  Ref. Range 08/02/2018 07:39 08/02/2018 11:48 08/02/2018 15:45 08/02/2018 20:56 08/03/2018 03:49 08/03/2018 07:03 08/03/2018 08:05  Glucose-Capillary Latest Ref Range: 70 - 99 mg/dL 96 409 (H) 92 81 811 (H) 316 (H) 267 (H)  Results for LADONTE, VERSTRAETE (MRN [id number]) as of 08/03/2018 10:31  Ref. Range 08/02/2018 08:43  Hemoglobin A1C Latest Ref Range: 4.8 - 5.6 % 8.8 (H)   Review of Glycemic Control  DM history: DM73 (dx at 57 years old; makes NO insulin and will require basal, correction, and meal coverage insulin) Outpatient Diabetes medications:Medtronic insulin pump with Novolog Current orders for Inpatient glycemic control: Lantus 10 units QHS, Novolog 0-9 units Q4H; Vital @ 50 ml/hr  Inpatient Diabetes Program Recommendations:  Insulin - Basal:  Please consider increasing Lantus to 15 units and change frequency to daily (since being given at 10 am) starting on 08/04/18.   If MD is agreeable, please order an additional Lantus 5 units x 1 now (for total of 15 units today).  Insulin-Tube Feeding Coverage: Please consider ordering Novolog 2 units Q4H for tube feeding coverage. If tube feeding is stopped or held then Novolog tube feeding coverage should also be stopped or held.  HbgA1C: A1C 8.8% on 08/02/18 indicating an average glucose of 206 mg/dl over the past 2-3 months.   Thanks, Orlando Penner, RN, MSN, CDE Diabetes Coordinator Inpatient Diabetes Program [phone] (Team Pager from 8am to 5pm)

## 2018-08-04 ENCOUNTER — Inpatient Hospital Stay (HOSPITAL_COMMUNITY): Payer: BLUE CROSS/BLUE SHIELD

## 2018-08-04 ENCOUNTER — Encounter (HOSPITAL_COMMUNITY): Payer: BLUE CROSS/BLUE SHIELD

## 2018-08-04 ENCOUNTER — Inpatient Hospital Stay: Payer: Self-pay

## 2018-08-04 DIAGNOSIS — G92 Toxic encephalopathy: Secondary | ICD-10-CM

## 2018-08-04 LAB — CBC
HEMATOCRIT: 31.7 % — AB (ref 39.0–52.0)
HEMOGLOBIN: 9.9 g/dL — AB (ref 13.0–17.0)
MCH: 28.9 pg (ref 26.0–34.0)
MCHC: 31.2 g/dL (ref 30.0–36.0)
MCV: 92.7 fL (ref 80.0–100.0)
Platelets: 124 10*3/uL — ABNORMAL LOW (ref 150–400)
RBC: 3.42 MIL/uL — AB (ref 4.22–5.81)
RDW: 14.4 % (ref 11.5–15.5)
WBC: 12.9 10*3/uL — AB (ref 4.0–10.5)
nRBC: 0 % (ref 0.0–0.2)

## 2018-08-04 LAB — GLUCOSE, CAPILLARY
GLUCOSE-CAPILLARY: 329 mg/dL — AB (ref 70–99)
Glucose-Capillary: 212 mg/dL — ABNORMAL HIGH (ref 70–99)
Glucose-Capillary: 217 mg/dL — ABNORMAL HIGH (ref 70–99)
Glucose-Capillary: 272 mg/dL — ABNORMAL HIGH (ref 70–99)
Glucose-Capillary: 296 mg/dL — ABNORMAL HIGH (ref 70–99)
Glucose-Capillary: 323 mg/dL — ABNORMAL HIGH (ref 70–99)
Glucose-Capillary: 413 mg/dL — ABNORMAL HIGH (ref 70–99)

## 2018-08-04 LAB — HEPATIC FUNCTION PANEL
ALBUMIN: 2.3 g/dL — AB (ref 3.5–5.0)
ALT: 132 U/L — AB (ref 0–44)
AST: 194 U/L — AB (ref 15–41)
Alkaline Phosphatase: 225 U/L — ABNORMAL HIGH (ref 38–126)
BILIRUBIN DIRECT: 0.3 mg/dL — AB (ref 0.0–0.2)
Indirect Bilirubin: 0.8 mg/dL (ref 0.3–0.9)
Total Bilirubin: 1.1 mg/dL (ref 0.3–1.2)
Total Protein: 5 g/dL — ABNORMAL LOW (ref 6.5–8.1)

## 2018-08-04 LAB — CULTURE, RESPIRATORY

## 2018-08-04 LAB — RENAL FUNCTION PANEL
ANION GAP: 7 (ref 5–15)
Albumin: 2.3 g/dL — ABNORMAL LOW (ref 3.5–5.0)
BUN: 59 mg/dL — ABNORMAL HIGH (ref 6–20)
CO2: 26 mmol/L (ref 22–32)
Calcium: 8.1 mg/dL — ABNORMAL LOW (ref 8.9–10.3)
Chloride: 109 mmol/L (ref 98–111)
Creatinine, Ser: 1.95 mg/dL — ABNORMAL HIGH (ref 0.61–1.24)
GFR calc non Af Amer: 36 mL/min — ABNORMAL LOW (ref >60)
GFR, EST AFRICAN AMERICAN: 42 mL/min — AB (ref >60)
GLUCOSE: 353 mg/dL — AB (ref 70–99)
PHOSPHORUS: 1.8 mg/dL — AB (ref 2.5–4.6)
POTASSIUM: 3.9 mmol/L (ref 3.5–5.1)
Sodium: 142 mmol/L (ref 135–145)

## 2018-08-04 LAB — MAGNESIUM: MAGNESIUM: 2.2 mg/dL (ref 1.7–2.4)

## 2018-08-04 LAB — CULTURE, RESPIRATORY W GRAM STAIN

## 2018-08-04 LAB — PROCALCITONIN: Procalcitonin: 4.24 ng/mL

## 2018-08-04 MED ORDER — INSULIN ASPART 100 UNIT/ML ~~LOC~~ SOLN
0.0000 [IU] | SUBCUTANEOUS | Status: DC
  Administered 2018-08-04: 15 [IU] via SUBCUTANEOUS

## 2018-08-04 MED ORDER — INSULIN ASPART 100 UNIT/ML ~~LOC~~ SOLN
0.0000 [IU] | SUBCUTANEOUS | Status: DC
  Administered 2018-08-04 – 2018-08-05 (×3): 5 [IU] via SUBCUTANEOUS
  Administered 2018-08-05: 8 [IU] via SUBCUTANEOUS
  Administered 2018-08-05: 5 [IU] via SUBCUTANEOUS
  Administered 2018-08-05: 8 [IU] via SUBCUTANEOUS
  Administered 2018-08-05: 5 [IU] via SUBCUTANEOUS
  Administered 2018-08-06 (×4): 3 [IU] via SUBCUTANEOUS
  Administered 2018-08-06: 5 [IU] via SUBCUTANEOUS
  Administered 2018-08-06: 15 [IU] via SUBCUTANEOUS
  Administered 2018-08-07: 5 [IU] via SUBCUTANEOUS
  Administered 2018-08-07 (×2): 3 [IU] via SUBCUTANEOUS

## 2018-08-04 MED ORDER — DOCUSATE SODIUM 50 MG/5ML PO LIQD
100.0000 mg | Freq: Two times a day (BID) | ORAL | Status: DC
  Administered 2018-08-04 – 2018-08-07 (×7): 100 mg
  Filled 2018-08-04 (×7): qty 10

## 2018-08-04 MED ORDER — INSULIN GLARGINE 100 UNIT/ML ~~LOC~~ SOLN
15.0000 [IU] | Freq: Every day | SUBCUTANEOUS | Status: DC
  Administered 2018-08-04 – 2018-08-05 (×2): 15 [IU] via SUBCUTANEOUS
  Filled 2018-08-04 (×3): qty 0.15

## 2018-08-04 MED ORDER — INSULIN ASPART 100 UNIT/ML ~~LOC~~ SOLN
2.0000 [IU] | SUBCUTANEOUS | Status: DC
  Administered 2018-08-04: 2 [IU] via SUBCUTANEOUS

## 2018-08-04 MED ORDER — INSULIN ASPART 100 UNIT/ML ~~LOC~~ SOLN
4.0000 [IU] | SUBCUTANEOUS | Status: DC
  Administered 2018-08-04 – 2018-08-05 (×7): 4 [IU] via SUBCUTANEOUS

## 2018-08-04 MED ORDER — POTASSIUM PHOSPHATES 15 MMOLE/5ML IV SOLN
30.0000 mmol | Freq: Once | INTRAVENOUS | Status: AC
  Administered 2018-08-04: 30 mmol via INTRAVENOUS
  Filled 2018-08-04: qty 10

## 2018-08-04 MED ORDER — LEVETIRACETAM IN NACL 1000 MG/100ML IV SOLN
1000.0000 mg | Freq: Once | INTRAVENOUS | Status: AC
  Administered 2018-08-04: 1000 mg via INTRAVENOUS
  Filled 2018-08-04: qty 100

## 2018-08-04 MED ORDER — SODIUM CHLORIDE 0.9% FLUSH
10.0000 mL | Freq: Two times a day (BID) | INTRAVENOUS | Status: DC
  Administered 2018-08-04 – 2018-08-07 (×5): 10 mL

## 2018-08-04 MED ORDER — FUROSEMIDE 10 MG/ML IJ SOLN
40.0000 mg | Freq: Once | INTRAMUSCULAR | Status: AC
  Administered 2018-08-04: 40 mg via INTRAVENOUS
  Filled 2018-08-04: qty 4

## 2018-08-04 MED ORDER — LEVETIRACETAM IN NACL 500 MG/100ML IV SOLN
500.0000 mg | Freq: Two times a day (BID) | INTRAVENOUS | Status: DC
  Administered 2018-08-05 – 2018-08-07 (×5): 500 mg via INTRAVENOUS
  Filled 2018-08-04 (×5): qty 100

## 2018-08-04 MED ORDER — FAMOTIDINE 40 MG/5ML PO SUSR
20.0000 mg | Freq: Two times a day (BID) | ORAL | Status: DC
  Administered 2018-08-04 – 2018-08-07 (×7): 20 mg
  Filled 2018-08-04 (×7): qty 2.5

## 2018-08-04 MED ORDER — SODIUM CHLORIDE 0.9% FLUSH: 10.0000 mL | INTRAVENOUS | Status: DC | PRN

## 2018-08-04 MED ORDER — LORAZEPAM 2 MG/ML IJ SOLN
1.0000 mg | INTRAMUSCULAR | Status: DC
  Administered 2018-08-04 – 2018-08-05 (×6): 1 mg via INTRAVENOUS
  Filled 2018-08-04 (×6): qty 1

## 2018-08-04 MED ORDER — INSULIN GLARGINE 100 UNIT/ML ~~LOC~~ SOLN: 15.0000 [IU] | Freq: Every day | SUBCUTANEOUS | Status: DC

## 2018-08-04 NOTE — Progress Notes (Signed)
Peripherally Inserted Central Catheter/Midline Placement  The IV Nurse has discussed with the patient and/or persons authorized to consent for the patient, the purpose of this procedure and the potential benefits and risks involved with this procedure.  The benefits include less needle sticks, lab draws from the catheter, and the patient may be discharged home with the catheter. Risks include, but not limited to, infection, bleeding, blood clot (thrombus formation), and puncture of an artery; nerve damage and irregular heartbeat and possibility to perform a PICC exchange if needed/ordered by physician.  Alternatives to this procedure were also discussed.  Bard Power PICC patient education guide, fact sheet on infection prevention and patient information card has been provided to patient /or left at bedside.    PICC/Midline Placement Documentation     Consent obtained with wife via telephone   Ruben Harmon [facility] 08/04/2018, 3:54 PM

## 2018-08-04 NOTE — Procedures (Signed)
ELECTROENCEPHALOGRAM REPORT   Patient: Ruben Harmon       Room #: 2H21C EEG No. ID: [id number] Age: 57 y.o.        Sex: male Referring Physician: Vassie Loll Report Date:  08/04/2018        Interpreting Physician: Thana Farr  History: Ruben Harmon is an 57 y.o. male s/p arrest  Medications:  Colace, Folvite, Pepcid, Insulin, Keppra, Zosyn, Thiamine, Diprovan  Conditions of Recording:  This is a 21 channel routine scalp EEG performed with bipolar and monopolar montages arranged in accordance to the international 10/20 system of electrode placement. One channel was dedicated to EKG recording.  The patient is in the intubated and sedated state.  Description:  The background activity is slow and poorly organized.   It consists of a low voltage, polymorphic delta rhythm that is diffusely distributed and continuous.  The patient is stimulated multiple times during the recording and despite stimulation there is no change in the background rhythm.  Sedation is held during the recording as well and despite this hold in sedation there is no change in the background rhythm.  No epileptiform activity is noted.   Hyperventilation and intermittent photic stimulation were not performed.   IMPRESSION: This is an abnormal EEG secondary to general background slowing.  This finding may be seen with a diffuse disturbance that is etiologically nonspecific, but may include a metabolic encephalopathy or medication effect, among other possibilities.  No epileptiform activity was noted.     Thana Farr, MD Neurology [phone] 08/04/2018, 1:27 PM

## 2018-08-04 NOTE — Progress Notes (Signed)
Inpatient Diabetes Program Recommendations  AACE/ADA: New Consensus Statement on Inpatient Glycemic Control (2019)  Target Ranges:  Prepandial:   less than 140 mg/dL      Peak postprandial:   less than 180 mg/dL (1-2 hours)      Critically ill patients:  140 - 180 mg/dL   Results for KAILON, TREESE (MRN [id number]) as of 08/04/2018 11:34  Ref. Range 08/03/2018 08:05 08/03/2018 11:43 08/03/2018 15:32 08/03/2018 20:13 08/03/2018 23:37 08/04/2018 03:44 08/04/2018 08:33  Glucose-Capillary Latest Ref Range: 70 - 99 mg/dL 409 (H) 811 (H) 914 (H) 300 (H) 327 (H) 296 (H) 329 (H)    Review of Glycemic Control  DM history: DM87 (dx at 57 years old; makes NO insulin and will require basal, correction, and meal coverage insulin) Outpatient Diabetes medications:Medtronic insulin pump with Novolog Current orders for Inpatient glycemic control:Lantus 15 units QHS, Novolog 0-15 units Q4H; Novolog 2 units Q4H for Tube Feeding coverage;  Vital @ 50 ml/hr  Inpatient Diabetes Program Recommendations:  Insulin - Basal:Please consider changing frequency of Lantus 15 units to daily starting today.   Insulin-Tube Feeding Coverage: Please consider increasing tube feeding coverage to Novolog 4 units Q4H for tube feeding coverage. If tube feeding is stopped or held then Novolog tube feeding coverage should also be stopped or held.  Insulin-Correction: If the above changes are made and glucose continues to be consistently >180 mg/dl, may want to use ICU Glycemic Control order set to improve glycemic control.  HbgA1C: A1C 8.8% on 08/02/18 indicating an average glucose of 206 mg/dl over the past 2-3 months.   Thanks, Orlando Penner, RN, MSN, CDE Diabetes Coordinator Inpatient Diabetes Program [phone] (Team Pager from 8am to 5pm)

## 2018-08-04 NOTE — Progress Notes (Signed)
LTM started; nurse educated on event button. Dr Sass notified. 

## 2018-08-04 NOTE — Consult Note (Addendum)
Neurology Consultation  Reason for Consult: Encephalopathy  Referring Physician: PCCM  CC: Found down, cardiac arrest   History is obtained from: Chart reivew  HPI: Ruben Harmon is a 57 y.o. male with a pmhx of insulin dependent diabetes, bipolar disorder, and drug use who presented on 11/03 after cardiac arrest. He was found unresponsive in a hotel room by his friend. Patient was hypotensive on EMS arrival and glucose was severely elevated, too high to read on machine. EKG showed diffuse ST elevation. En route to the ED patient lost pulse and CPR was initiated. Patient's friend reported that he had been acting more lethargic for the past 24 hours and had multiple episodes of vomiting. They did use crack cocaine approximately 24 hours prior to patient becoming unresponsive. CPR was underway when he presented to the ED, and ACLS was continued. Unclear per chart reivew, but sounds as if he was coded for approximately 25 minutes (10 per EMS, 15 in ED). After achieving ROSC, bedside echocardiogram showed diffuse hypokinesis. Repeat EKG with widened QRS but no ST changes. Labs with CBG > 700, severe hyperkalemia, and lactic acidosis. It was also noted that patient's insulin pump was empty. He was started on an insulin drip, admitted to the ICU, and cooling protocol was initiated. EEG on 11/4 was withoug epileptiform activity. CT head unremarkable. Cooling protocol discontinued the following day due to suspected purposeful movement.   Since admission he has remained intubated in the ICU. Successfully weaned off pressors 11/4 but has remained agitated requiring high doses of precidex, now on propofol. Per documentation, had some purposeful movements in all 4 extremities yesterday but fixed upward gaze. Would look at person calling name but would not follow commands. MRI brain today demonstrates multifocal abnormal diffusion restriction, predominately in the mesial temporal lobes and bilateral cerebellar  hemispheres.   ROS:  Unable to obtain due to altered mental status.   History reviewed. No pertinent past medical history. Unable to obtain  No family history on file. Unable to obtain  Social History:   reports that he has been smoking. He has never used smokeless tobacco. He reports that he drinks alcohol. He reports that he has current or past drug history. Drug: Cocaine.  Medications  Current Facility-Administered Medications:  .  0.9 %  sodium chloride infusion, , Intravenous, PRN, Omar Person, NP, Stopped at 08/02/18 [phone] .  0.9 %  sodium chloride infusion, , Intravenous, PRN, Omar Person, NP, Last Rate: 10 mL/hr at 08/03/18 1002 .  0.9 %  sodium chloride infusion, , Intravenous, Continuous, Corey Harold, NP, Last Rate: 10 mL/hr at 08/04/18 0700 .  acetaminophen (TYLENOL) tablet 650 mg, 650 mg, Oral, Q4H PRN, Collier Bullock, MD, 650 mg at 08/04/18 0141 .  albuterol (PROVENTIL) (2.5 MG/3ML) 0.083% nebulizer solution 2.5 mg, 2.5 mg, Nebulization, Q4H PRN, Omar Person, NP .  bisacodyl (DULCOLAX) suppository 10 mg, 10 mg, Rectal, Daily PRN, Collier Bullock, MD .  chlorhexidine gluconate (MEDLINE KIT) (PERIDEX) 0.12 % solution 15 mL, 15 mL, Mouth Rinse, BID, Collier Bullock, MD, 15 mL at 08/04/18 0844 .  Chlorhexidine Gluconate Cloth 2 % PADS 6 each, 6 each, Topical, Daily, Collier Bullock, MD, 6 each at 08/03/18 [phone] .  docusate (COLACE) 50 MG/5ML liquid 100 mg, 100 mg, Per Tube, BID, Jennelle Human B, NP, 100 mg at 08/04/18 1033 .  famotidine (PEPCID) 40 MG/5ML suspension 20 mg, 20 mg, Per Tube, BID, Jennelle Human B, NP, 20 mg at 08/04/18 1035 .  feeding supplement (PRO-STAT SUGAR FREE 64) liquid 30 mL, 30 mL, Per Tube, BID, Corey Harold, NP, 30 mL at 08/04/18 1034 .  feeding supplement (VITAL HIGH PROTEIN) liquid 1,000 mL, 1,000 mL, Per Tube, Q24H, Collier Bullock, MD, 1,000 mL at 08/04/18 0426 .  fentaNYL (SUBLIMAZE) injection 100 mcg, 100 mcg,  Intravenous, Q2H PRN, Corey Harold, NP, 100 mcg at 08/04/18 0857 .  folic acid (FOLVITE) tablet 1 mg, 1 mg, Per Tube, Daily, Corey Harold, NP, 1 mg at 08/04/18 1034 .  heparin injection 5,000 Units, 5,000 Units, Subcutaneous, Q8H, Collier Bullock, MD, 5,000 Units at 08/04/18 9563 .  hydrALAZINE (APRESOLINE) injection 10-20 mg, 10-20 mg, Intravenous, Q4H PRN, Rigoberto Noel, MD .  Influenza vac split quadrivalent PF (FLUARIX) injection 0.5 mL, 0.5 mL, Intramuscular, Prior to discharge, Rigoberto Noel, MD .  insulin aspart (novoLOG) injection 0-15 Units, 0-15 Units, Subcutaneous, Q4H, Simpson, Paula B, NP .  insulin aspart (novoLOG) injection 2 Units, 2 Units, Subcutaneous, Q4H, Simpson, Paula B, NP .  insulin glargine (LANTUS) injection 15 Units, 15 Units, Subcutaneous, QHS, Simpson, Paula B, NP .  ipratropium-albuterol (DUONEB) 0.5-2.5 (3) MG/3ML nebulizer solution 3 mL, 3 mL, Nebulization, Q6H, Omar Person, NP, 3 mL at 08/04/18 0808 .  MEDLINE mouth rinse, 15 mL, Mouth Rinse, 10 times per day, Collier Bullock, MD, 15 mL at 08/04/18 1048 .  midazolam (VERSED) injection 2 mg, 2 mg, Intravenous, Q2H PRN, Collier Bullock, MD, 2 mg at 08/04/18 0315 .  ondansetron (ZOFRAN) injection 4 mg, 4 mg, Intravenous, Q6H PRN, Collier Bullock, MD .  piperacillin-tazobactam (ZOSYN) IVPB 3.375 g, 3.375 g, Intravenous, Q8H, Collier Bullock, MD, Last Rate: 12.5 mL/hr at 08/04/18 0830, 3.375 g at 08/04/18 0830 .  pneumococcal 23 valent vaccine (PNU-IMMUNE) injection 0.5 mL, 0.5 mL, Intramuscular, Prior to discharge, Rigoberto Noel, MD .  potassium PHOSPHATE 30 mmol in dextrose 5 % 500 mL infusion, 30 mmol, Intravenous, Once, Jennelle Human B, NP, Last Rate: 85 mL/hr at 08/04/18 1058, 30 mmol at 08/04/18 1058 .  propofol (DIPRIVAN) 1000 MG/100ML infusion, 0-50 mcg/kg/min, Intravenous, Titrated, Einar Grad, RPH, Last Rate: 25.1 mL/hr at 08/04/18 0700, 50 mcg/kg/min at 08/04/18 0700 .  sodium  chloride flush (NS) 0.9 % injection 10-40 mL, 10-40 mL, Intracatheter, Q12H, Collier Bullock, MD, 30 mL at 08/03/18 1004 .  sodium chloride flush (NS) 0.9 % injection 10-40 mL, 10-40 mL, Intracatheter, PRN, Collier Bullock, MD .  thiamine (B-1) injection 100 mg, 100 mg, Intravenous, Daily, Collier Bullock, MD, 100 mg at 08/04/18 1034   Exam: Current vital signs: BP 139/60   Pulse (!) 126   Temp (!) 100.4 F (38 C)   Resp (!) 26   Ht 5' 6.5" (1.689 m)   Wt 81.2 kg   SpO2 97%   BMI 28.46 kg/m  Vital signs in last 24 hours: Temp:  [99.1 F (37.3 C)-101.3 F (38.5 C)] 100.4 F (38 C) (11/07 1000) Pulse Rate:  [64-132] 126 (11/07 1000) Resp:  [16-35] 26 (11/07 1000) BP: (102-189)/(37-84) 139/60 (11/07 1000) SpO2:  [91 %-100 %] 97 % (11/07 1000) FiO2 (%):  [40 %] 40 % (11/07 0903) Weight:  [81.2 kg] 81.2 kg (11/07 0500)  GENERAL: On vent HEENT: - Normocephalic and atraumatic, dry mm, no LN++, no Thyromegally LUNGS - Clear to auscultation bilaterally with no wheezes CV - S1S2 RRR, no m/r/g, equal pulses bilaterally. ABDOMEN - Soft, nontender, nondistended with normoactive BS Ext: warm, well perfused, intact peripheral  pulses, no edema  NEURO:  Mental Status: Was on propofol.  Propofol was held for examination.  Did not follow any commands. Language: unable to assess Cranial Nerves: PERRL 2 mm / sluggish, corneal reflexes intact bilaterally. No facial asymmetry. Hearing appears intact, turns head towards voices Motor: Spontaneously moving all extremities against gravity strongly without any focal weakness. Tone: is normal and bulk is normal Sensation: No response to noxious stimuli Coordination: Unable to assess Gait- deferred   Labs I have reviewed labs in epic and the results pertinent to this consultation are:   CBC    Component Value Date/Time   WBC 12.9 (H) 08/04/2018 0427   RBC 3.42 (L) 08/04/2018 0427   HGB 9.9 (L) 08/04/2018 0427   HCT 31.7 (L) 08/04/2018  0427   PLT 124 (L) 08/04/2018 0427   MCV 92.7 08/04/2018 0427   MCH 28.9 08/04/2018 0427   MCHC 31.2 08/04/2018 0427   RDW 14.4 08/04/2018 0427   LYMPHSABS 3.8 08/16/2018 1949   MONOABS 1.4 (H) 08/02/2018 1949   EOSABS 0.0 08/14/2018 1949   BASOSABS 0.0 08/16/2018 1949    CMP     Component Value Date/Time   NA 142 08/04/2018 0427   K 3.9 08/04/2018 0427   CL 109 08/04/2018 0427   CO2 26 08/04/2018 0427   GLUCOSE 353 (H) 08/04/2018 0427   BUN 59 (H) 08/04/2018 0427   CREATININE 1.95 (H) 08/04/2018 0427   CALCIUM 8.1 (L) 08/04/2018 0427   PROT 5.0 (L) 08/04/2018 0427   ALBUMIN 2.3 (L) 08/04/2018 0427   ALBUMIN 2.3 (L) 08/04/2018 0427   AST 194 (H) 08/04/2018 0427   ALT 132 (H) 08/04/2018 0427   ALKPHOS 225 (H) 08/04/2018 0427   BILITOT 1.1 08/04/2018 0427   GFRNONAA 36 (L) 08/04/2018 0427   GFRAA 42 (L) 08/04/2018 0427    Lipid Panel     Component Value Date/Time   CHOL 77 08/05/2018 2203   TRIG 108 08/03/2018 1051   HDL 34 (L) 08/06/2018 2203   CHOLHDL 2.3 08/14/2018 2203   VLDL 22 08/10/2018 2203   LDLCALC 21 08/21/2018 2203     Imaging I have reviewed the images obtained:  08/05/2018 CT Head Wo Contrast:  IMPRESSION: 1. No acute intracranial abnormalities. 2. Sinus disease as above.  Fluid in the maxillary sinuses. 3. No fracture or traumatic malalignment in the cervical spine. 4. Mild ground-glass in the left lung apex is likely infectious or Inflammatory.  08/04/18 MRI Brain Wo Contrast: IMPRESSION: 1. Multifocal abnormal diffusion restriction, predominantly within the mesial temporal lobes and bilateral cerebellar hemispheres. This pattern is consistent with drug overdose, most typically heroin leukoencephalopathy. 2. No hemorrhage or mass effect.  Assessment: 57 yo M with pmhx of IDDM, bipolar disorder, and drug abuse found down on 11/4 after crack cocaine use, subsequent cardiac arrest requiring ~ 25 minutes ACLS with ROSC. Found to have severe  metabolic derangements of DKA, hyperkalemia, and lactic acidosis who remains intubated on hospital day 4 with agitation requiring sedation. Patient does not follow commands but is moving all extremities spontaneously and brainstem reflexes are intact. MRI brain today demonstrates diffusion restriction in the bilateral hippocampi and cerebellar hemispheres. Findings could be multifactorial in the setting of his drug use and anoxic brain injury. Mesial temporal lobes seizures cannot be excluded. Unfortunately due to the location, epileptic activity may be hard to pick up on EEG. Long discussion with wife and sister per Dr. Rory Percy. In the short term, will treat empirically for  seizures to rule out reversible causes. Repeat MRI in 48-72 hours. If no improvement clinically or radiographically, seizures can be more confidently ruled out. Long term prognosis and recovery remain unclear at this point.  This is because there are multiple potential contributors to his toxic metabolic encephalopathy.  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    Recommendations: -- Start IV keppra 1g load now; then 500 mg q12h  -- Start IV ativan 1 mg q4h  -- F/u repeat EEG; plan for prolonged EEG monitoring overnight --Consider repeat MRI 48-72 hours if exam does not improve and EEG remains unhelpful as well. -- Will follow   Velna Ochs, M.D. - PGY3 08/04/2018, 11:26 AM   Attending Neurohospitalist Addendum Patient seen and examined with APP/Resident. Agree with the history and physical as documented above. Agree with the plan as documented, which I helped formulate. I have independently reviewed the chart, obtained history, review of systems and examined the patient.I have personally reviewed pertinent head/neck/spine imaging (CT/MRI). Please feel free to call with any  questions. --- Amie Portland, MD [facility] Pager: [phone]  If 7pm to 7am, please call on call as listed on AMION.   CRITICAL CARE ATTESTATION This patient is critically ill and at significant risk of neurological worsening, death and care requires constant monitoring of vital signs, hemodynamics, respiratory, and cardiac monitoring. I spent 40  minutes of neurocritical care time performing neurological assessment, discussion with family, other specialists and medical decision making of high complexity in the care of  this patient.

## 2018-08-04 NOTE — Progress Notes (Signed)
NAME:  Ruben Harmon, MRN:  [id number], DOB:  1961-07-24, LOS: 4 ADMISSION DATE:  2018-08-18, CONSULTATION DATE:  11/3 REFERRING MD:  Otho Najjar, CHIEF COMPLAINT:  Cardiac Arrest    History of Present Illness   57 year old male with history of drug abuse recently out of rehab found down 11/3 and suffered 10 minute asystolic arrest. Started on TTM 36 and admitted to ICU.   Past Medical History  Tobacco Use, Cocaine Use, HTN    Significant Hospital Events   11/3 > Presents to ED  11/4 off pressors, starting to respond some. On 36TTM requiring very low water temps to maintain.   11/6 off pressors, precedex changed to propofol, failed SBT 2/2 ongoing   Consults: date of consult/date signed off & final recs:  PCCM 11/3   Procedures (surgical and bedside):  ETT 11/3 > Right Femoral CVC 11/3 >   Right Femoral Aline 11/3 > out Foley 11/3 >>  Significant Diagnostic Tests:  CXR 11/3 > Cardiac shadow is within normal limits. Endotracheal tube and nasogastric catheter are noted in satisfactory position. The lungs are clear. No bony abnormality is seen. No pneumothorax is noted. CT Head/C-Spine 11/4 > No acute intracranial abnormalities.  Fluid in the maxillary sinuses. No fracture or traumatic malalignment in the cervical spine. Mild ground-glass in the left lung apex is likely infectious or inflammatory. CT Chest/A 11/4> Patchy airspace infiltrates throughout both lungs with consolidation in the lung bases. This could represent edema, pneumonia, aspiration, or contusion. No acute process demonstrated in the abdomen or pelvis. Diffuse fatty infiltration of the liver. Enlarged prostate gland. Echo 11/4 >> LVEF 65-70%. Normal wall motion. Grade 1 DD.  EEG 11/4 > > abnormal, background slowing. Notably, the patient was on hefty doses of sedation  At the time of exam. No epileptiform activity noted.   11/7 MRI brain >> 1. Multifocal abnormal diffusion restriction, predominantly within the mesial  temporal lobes and bilateral cerebellar hemispheres. This pattern is consistent with drug overdose, most typically heroin leukoencephalopathy. 2. No hemorrhage or mass effect.  Micro Data:  11/3 MRSA PCR >> negative Blood 11/3 >> ngtd x 3 days Tracheal Asp 11/3 >> few yeast >>  Antimicrobials:  Zosyn 11/4 >> Vancomycin 11/4 >>  11/4  Subjective/Interval:  MRI brain noted as above from yesterday Off levophed  Weaned for 1 hour this am prior to becoming agitated, shivering, tachypneic in 40's, and tachycardic with wakeup assessment and propofol at half dose at 25 Did not f/c or have purposeful movements but moving all extremities Tmax 101.3 overnight, remains on cooling pads Net +8.9 L  Hyperglycemic  Objective   Blood pressure (!) 120/54, pulse 100, temperature 99.1 F (37.3 C), resp. rate (!) 23, height 5' 6.5" (1.689 m), weight 81.2 kg, SpO2 96 %. CVP:  [13 mmHg-15 mmHg] 14 mmHg  Vent Mode: CPAP;PSV FiO2 (%):  [40 %] 40 % Set Rate:  [26 bmp] 26 bmp Vt Set:  [510 mL] 510 mL PEEP:  [5 cmH20] 5 cmH20 Pressure Support:  [5 cmH20-12 cmH20] 12 cmH20 Plateau Pressure:  [19 cmH20-25 cmH20] 19 cmH20   Intake/Output Summary (Last 24 hours) at 08/04/2018 0842 Last data filed at 08/04/2018 0700 Gross per 24 hour  Intake 1806.18 ml  Output 1900 ml  Net -93.82 ml   Filed Weights   08/02/18 0434 08/03/18 0428 08/04/18 0500  Weight: 83.9 kg 83.7 kg 81.2 kg    Examination:  General:  Critically ill adult male on MV agitated on  MV HEENT: pupils 3/reactive, + corneal's bilaterally, scleral edema, anicteric, MM pink/moist, ETT at 24cm, OGT Neuro:  Agitated, MAE, non purposeful, not f/c CV: RR, ST 130s, non murmur, +2 pulses PULM: after sedation, even and nonlabored on MV, bilateral rhonchi/ rales, exp wheeze, thick tan secretions GI: soft, +BM, unclear last BM Extremities: warm/dry, generalized 1-2+ edema Skin: no rashes, some bruising to arms   Resolved Hospital Problem list     Anion gap acidosis, lactic acidosis/DKA  Assessment & Plan:   Acute hypercarbic respiratory failure s/p cardiac arrest.  -  CXR reviewed 11/7- stable ETT, similar increased interstitial edema from yesterday  - ongoing full MV support with daily SBT trials - mental status is still a barrier with ongoing agitation on wakeup assessment.  Ongoing copious secretions as well  - see below for abx therapy - lasix 40 mg now  - trend CXR   Asystolic Cardiac Arrest (Witnessed 10 minutes) with concern for hypoxic/anoxic injury. Etiology is suspected to be drug induced given history and witness reports. Troponin peaked 11/4 at 2.61. EKG with no evidence of ischemia. EEG and CT head negative 11/4.  Was following some commands on 11/5. - MRI brain showing multifocal diffusion restriction c/w drug overdoses- typically heroin leukoencephalopathy.  UDS positive for cocaine only.  No evidence of anoxia.  Consider drug withdrawals given ongoing encephalopathy  - Telemetry monitoring in ICU - Avoiding fevers- normothermia, remains on cooling pads  - minimize sedation as able - repeat EEG given no significant improvements in mental status   - continue PAD protocol with propofol and prn fentanyl, bowel regimen in place, colace changed to BID   Septic vs Hypovolemic Shock vs sedation needs- resolved  - goal MAP > 65 - trending UOP/ strict I/O's - will obtain 2 working PIV and remove femoral CVC   DM1 managed on insulin pump at home, but recent compliance has been poor. Has been seem by DM coordinator 11/4.  - ongoing hyperglycemia with closed AG from yesterday - increase lantus to 15 units daily  - add tube feed coverage 2 units q 4 - lantus 10 units now (held overnight due to hypoglycemic episode) - SSI increase to moderate  - CBG q 4  Acute Kidney Injury:  - UOP stable and sCr continues to improve - trend renal panel  Hypokalemia- resolved Hypophos Kphos 30 mmol now  Check mag  Probable  aspiration pneumonia Leukocytosis - improving   - continue zosyn for now - improving WBC, but remains febrile and PCT at 4.24 - follow sputum cx and bc  Substance abuse H/O Tobacco Use - continue daily Thiamine, folate.   Transaminitis, improving  - trend LFTs  Disposition / Summary of Today's Plan 08/04/18   Repeat EEG today Minimize sedation as able  Diuresis and electrolyte repletion Adjusted SSI/ lantus    Diet: TF at goal  VAP protocol DVT prophylaxis: Heparin sq GI prophylaxis: Pepcid changed to per tube Hyperglycemia protocol: as above Mobility: Bedrest Code Status: Full code. Prognosis guarded. Family Communication: no family at bedside. Wife updated by phone per Dr. Vassie Loll on 11/6  Labs   CBC: Recent Labs  Lab 08/04/2018 1949 08/02/2018 1957 08/02/18 0353 08/03/18 0458 08/04/18 0427  WBC 34.9*  --  22.6* 16.3* 12.9*  NEUTROABS 29.7*  --   --   --   --   HGB 11.6* 13.3 12.1* 10.3* 9.9*  HCT 42.2 39.0 36.3* 32.2* 31.7*  MCV 111.9*  --  87.9 93.3 92.7  PLT 298  --  210 136* 124*    Basic Metabolic Panel: Recent Labs  Lab 08/26/2018 2203  08/01/18 0943 08/01/18 1323 08/01/18 1945 08/02/18 0353  08/02/18 1331 08/02/18 2131 08/03/18 0115 08/03/18 0458 08/04/18 0427  NA 125*   < >  --  142 143 144   < > 144 146* 145 142 142  K 5.0   < >  --  3.7 4.1 3.8   < > 4.2 3.8 3.8 4.0 3.9  CL 84*   < >  --  104 104 108   < > 108 108 109 108 109  CO2 9*   < >  --  27 25 27    < > 26 29 27 25 26   GLUCOSE 1,242*   < >  --  177* 160* 99   < > 146* 115* 212* 351* 353*  BUN 70*   < >  --  65* 63* 59*   < > 57* 61* 61* 61* 59*  CREATININE 3.99*   < >  --  2.85* 2.97* 2.69*   < > 2.50* 2.50* 2.40* 2.31* 1.95*  CALCIUM 7.8*   < >  --  7.6* 7.9* 7.7*   < > 7.8* 7.7* 7.6* 7.4* 8.1*  MG 3.0*  --  2.5* 2.3 2.5* 2.3  --   --   --   --   --   --   PHOS 12.0*  --  4.2 3.5 3.6 3.3  --   --   --   --   --  1.8*   < > = values in this interval not displayed.   GFR: Estimated  Creatinine Clearance: 42.3 mL/min (A) (by C-G formula based on SCr of 1.95 mg/dL (H)). Recent Labs  Lab 08/02/2018 1949 08/14/2018 1957 08/19/2018 2203 08/17/2018 2329 08/01/18 0443 08/02/18 0353 08/03/18 0458 08/04/18 0427  PROCALCITON  --   --  22.99  --   --   --   --  4.24  WBC 34.9*  --   --   --   --  22.6* 16.3* 12.9*  LATICACIDVEN  --  14.33* 12.0* 7.7* 4.0*  --   --   --     Liver Function Tests: Recent Labs  Lab 08/03/2018 2203 08/03/18 0458 08/04/18 0427  AST 159* 415* 194*  ALT 90* 161* 132*  ALKPHOS 86 195* 225*  BILITOT 1.3* 1.4* 1.1  PROT 4.6* 4.8* 5.0*  ALBUMIN 2.7* 2.4* 2.3*  2.3*   Recent Labs  Lab 08/14/2018 2203  LIPASE 20  AMYLASE 19*   No results for input(s): AMMONIA in the last 168 hours.  ABG    Component Value Date/Time   PHART 7.345 (L) 08/01/2018 0442   PCO2ART 47.3 08/01/2018 0442   PO2ART 116.0 (H) 08/01/2018 0442   HCO3 25.9 08/01/2018 0442   TCO2 27 08/01/2018 0442   ACIDBASEDEF 9.0 (H) 08/01/2018 0006   O2SAT 98.0 08/01/2018 0442     Coagulation Profile: Recent Labs  Lab 08/03/2018 1949  INR 1.56    Cardiac Enzymes: Recent Labs  Lab 08/01/18 0807 08/01/18 1323 08/02/18 0843 08/02/18 1443 08/02/18 2043  TROPONINI 2.56* 2.61* 1.49* 1.37* 1.14*    HbA1C: Hgb A1c MFr Bld  Date/Time Value Ref Range Status  08/02/2018 08:43 AM 8.8 (H) 4.8 - 5.6 % Final    Comment:    (NOTE) Pre diabetes:          5.7%-6.4% Diabetes:              >  6.4% Glycemic control for   <7.0% adults with diabetes     CBG: Recent Labs  Lab 08/03/18 1143 08/03/18 1532 08/03/18 2013 08/03/18 2337 08/04/18 0344  GLUCAP 200* 275* 300* 327* 296*     Critical care time: 60 mins     Posey Boyer, AGACNP-BC [facility] Pgr: [phone] or if no answer [phone] 08/04/2018, 8:42 AM

## 2018-08-04 NOTE — Progress Notes (Signed)
Pressure support increased to 12 due to increased RR > 40.  RT will continue to monitor.

## 2018-08-04 NOTE — Plan of Care (Signed)
  Problem: Nutrition: Goal: Adequate nutrition will be maintained Outcome: Progressing   Problem: Elimination: Goal: Will not experience complications related to urinary retention Outcome: Progressing   Problem: Cardiac: Goal: Ability to achieve and maintain adequate cardiopulmonary perfusion will improve Outcome: Progressing   Problem: Skin Integrity: Goal: Risk for impaired skin integrity will be minimized. Outcome: Progressing   Problem: Elimination: Goal: Will not experience complications related to bowel motility Outcome: Not Progressing

## 2018-08-04 NOTE — Progress Notes (Signed)
EEG completed; results pending.    

## 2018-08-04 NOTE — Progress Notes (Signed)
57 year old IDDM with insulin pump, admitted 11/3 with cardiac arrest and DKA, was recently discharged from rehab for substance abuse, urine toxicology positive for cocaine. Shock resolved Echo shows that LV function is adequate. Renal function is improving   He is febrile low-grade On exam-severe agitation on wake-up assessment on propofol lowered, opens his eyes but seems to have fixed upward gaze, does not follow commands, nonpurposeful agitation, moves all 4 extremities, clear breath sounds bilateral, no rhonchi, S1-S2 tacky.  Chest x-ray shows improved bilateral interstitial markings, personally reviewed Labs show improving renal function, decreasing LFTs and leukocytosis, hyperglycemia.  Impression/plan  Acute encephalopathy-MRI reviewed with neuro which shows restricted diffusion in both hippocampus and cerebellar hemispheres, this could be consistent with substance abuse, and anoxic encephalopathy or seizures.  Repeat EEG did not show any evidence of seizures Severe psychomotor agitation may be related to cocaine abuse but this should be resolving by now  Acute respiratory failure-he does wean well on pressure support, mental status is the main barrier to extubation  AKI -diuresis with Lasix today, improving  Aspiration pneumonia with bilateral infiltrates-improving continue Zosyn for 5 days total DKA -resolved, titrate dose of Lantus continues to feed coverage for better control sugars.  Wife Irving Burton arrived and was updated at bedside also sister.  They agree that he would not want to live with any kind of disability.  DNR issued he would certainly not want tracheostomy failure feeding tubes or even long-term ventilation.  Hope to prognosticate him better over the next 2 to 3 days prior to extubation  The patient is critically ill with multiple organ systems failure and requires high complexity decision making for assessment and support, frequent evaluation and titration of therapies,  application of advanced monitoring technologies and extensive interpretation of multiple databases. Critical Care Time devoted to patient care services described in this note independent of APP/resident  time is 35 minutes.   Comer Locket Vassie Loll MD [phone]

## 2018-08-04 NOTE — Progress Notes (Signed)
Pt placed back on full support due to increased work of breathing, increased agitation, increased heart rate.  RT will continue to monitor.

## 2018-08-05 ENCOUNTER — Inpatient Hospital Stay (HOSPITAL_COMMUNITY): Payer: BLUE CROSS/BLUE SHIELD

## 2018-08-05 DIAGNOSIS — L8990 Pressure ulcer of unspecified site, unspecified stage: Secondary | ICD-10-CM

## 2018-08-05 LAB — RENAL FUNCTION PANEL
ALBUMIN: 2.1 g/dL — AB (ref 3.5–5.0)
ANION GAP: 11 (ref 5–15)
BUN: 41 mg/dL — ABNORMAL HIGH (ref 6–20)
CALCIUM: 8 mg/dL — AB (ref 8.9–10.3)
CO2: 25 mmol/L (ref 22–32)
Chloride: 106 mmol/L (ref 98–111)
Creatinine, Ser: 1.41 mg/dL — ABNORMAL HIGH (ref 0.61–1.24)
GFR calc Af Amer: >60 mL/min (ref >60)
GFR calc non Af Amer: 54 mL/min — ABNORMAL LOW (ref >60)
GLUCOSE: 290 mg/dL — AB (ref 70–99)
PHOSPHORUS: 2.7 mg/dL (ref 2.5–4.6)
Potassium: 3.9 mmol/L (ref 3.5–5.1)
SODIUM: 142 mmol/L (ref 135–145)

## 2018-08-05 LAB — CBC
HEMATOCRIT: 29.8 % — AB (ref 39.0–52.0)
HEMOGLOBIN: 10.1 g/dL — AB (ref 13.0–17.0)
MCH: 31.8 pg (ref 26.0–34.0)
MCHC: 33.9 g/dL (ref 30.0–36.0)
MCV: 93.7 fL (ref 80.0–100.0)
NRBC: 0 % (ref 0.0–0.2)
Platelets: 138 10*3/uL — ABNORMAL LOW (ref 150–400)
RBC: 3.18 MIL/uL — ABNORMAL LOW (ref 4.22–5.81)
RDW: 14.6 % (ref 11.5–15.5)
WBC: 12.5 10*3/uL — AB (ref 4.0–10.5)

## 2018-08-05 LAB — GLUCOSE, CAPILLARY
GLUCOSE-CAPILLARY: 279 mg/dL — AB (ref 70–99)
GLUCOSE-CAPILLARY: 277 mg/dL — AB (ref 70–99)
Glucose-Capillary: 218 mg/dL — ABNORMAL HIGH (ref 70–99)
Glucose-Capillary: 231 mg/dL — ABNORMAL HIGH (ref 70–99)

## 2018-08-05 LAB — CULTURE, BLOOD (ROUTINE X 2)
Culture: NO GROWTH
Culture: NO GROWTH
Special Requests: ADEQUATE
Special Requests: ADEQUATE

## 2018-08-05 LAB — PROCALCITONIN: Procalcitonin: 1.89 ng/mL

## 2018-08-05 LAB — MAGNESIUM: Magnesium: 2.2 mg/dL (ref 1.7–2.4)

## 2018-08-05 MED ORDER — INSULIN GLARGINE 100 UNIT/ML ~~LOC~~ SOLN
20.0000 [IU] | Freq: Every day | SUBCUTANEOUS | Status: DC
  Administered 2018-08-06 – 2018-08-07 (×2): 20 [IU] via SUBCUTANEOUS
  Filled 2018-08-05 (×2): qty 0.2

## 2018-08-05 MED ORDER — LORAZEPAM 2 MG/ML IJ SOLN: 1.0000 mg | Freq: Three times a day (TID) | INTRAMUSCULAR | Status: DC | PRN

## 2018-08-05 MED ORDER — FUROSEMIDE 10 MG/ML IJ SOLN
40.0000 mg | Freq: Once | INTRAMUSCULAR | Status: AC
  Administered 2018-08-05: 40 mg via INTRAVENOUS
  Filled 2018-08-05: qty 4

## 2018-08-05 MED ORDER — INSULIN ASPART 100 UNIT/ML ~~LOC~~ SOLN
6.0000 [IU] | SUBCUTANEOUS | Status: DC
  Administered 2018-08-05 – 2018-08-07 (×10): 6 [IU] via SUBCUTANEOUS

## 2018-08-05 MED ORDER — INSULIN GLARGINE 100 UNIT/ML ~~LOC~~ SOLN
5.0000 [IU] | Freq: Once | SUBCUTANEOUS | Status: AC
  Administered 2018-08-05: 5 [IU] via SUBCUTANEOUS
  Filled 2018-08-05: qty 0.05

## 2018-08-05 MED ORDER — POTASSIUM CHLORIDE 20 MEQ/15ML (10%) PO SOLN
20.0000 meq | Freq: Once | ORAL | Status: AC
  Administered 2018-08-05: 20 meq
  Filled 2018-08-05: qty 15

## 2018-08-05 NOTE — Progress Notes (Signed)
Inpatient Diabetes Program Recommendations  AACE/ADA: New Consensus Statement on Inpatient Glycemic Control (2015)  Target Ranges:  Prepandial:   less than 140 mg/dL      Peak postprandial:   less than 180 mg/dL (1-2 hours)      Critically ill patients:  140 - 180 mg/dL   Results for Ruben Harmon, Ruben Harmon (MRN [id number]) as of 08/05/2018 08:52  Ref. Range 08/03/2018 23:37 08/04/2018 03:44 08/04/2018 08:33 08/04/2018 12:59 08/04/2018 16:55 08/04/2018 18:31 08/04/2018 20:00  Glucose-Capillary Latest Ref Range: 70 - 99 mg/dL 409 (H)  7 units NOVOLOG  296 (H)  5 units NOVOLOG  329 (H)  9 units NOVOLOG  413 (H)  15 units NOVOLOG +  15 units LANTUS at 3pm  323 (H)  19 units NOVOLOG  272 (H)    217 (H)  9 units NOVOLOG   Results for Ruben Harmon, Ruben Harmon (MRN [id number]) as of 08/05/2018 08:52  Ref. 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  Lantus dose also increased yesterday as well.  CBGs still in the 200s this AM.  Still getting tube feeds Vital @ 50 ml/hr.    MD- Please consider the following in-hospital insulin adjustments:  1. Increase Lantus to 20 units Daily (when insulin pump was interrogated, was discovered that patient gets about 28 units total basal insulin per day on his insulin pump)  2. Increase Novolog Tube Feed coverage slightly to: Novolog 6 units Q4 hours  HOLD if tube feeds HELD for any reason     --Will follow patient during hospitalization--  Ambrose Finland RN, MSN, CDE Diabetes Coordinator Inpatient Glycemic Control Team Team Pager: [phone] (8a-5p)

## 2018-08-05 NOTE — Progress Notes (Addendum)
NEURO HOSPITALIST PROGRESS NOTE   Subjective: Patient in bed sedated on propofol. ( Paused propofol for exam) breathing above the vent. Not responding to verbal stimuli.  Exam: Vitals:   08/05/18 0800 08/05/18 0806  BP: 133/75   Pulse: 92   Resp: (!) 23   Temp: 99 F (37.2 C)   SpO2: 100% 97%   GENERAL: On vent HEENT: - Normocephalic and atraumatic, dry mm, no LN++, no Thyromegally LUNGS - Clear to auscultation bilaterally with no wheezes CV - S1S2 RRR, no m/r/g, equal pulses bilaterally. ABDOMEN - Soft, nontender, nondistended with normoactive BS Ext: warm, well perfused, intact peripheral pulses, no edema  NEURO:  Mental Status: Was on propofol.  Propofol was held for examination.  Did not follow any commands. Language: unable to assess Cranial Nerves: midline gaze, PERRL pinpoint / sluggish, corneal reflexes intact bilaterally. No facial asymmetry. Cough and gag present.  Hearing appears intact, turns head towards voices Motor: Spontaneously moving all extremities against gravity strongly without any focal weakness yesterday, but not much spont movement today. Tone: is normal and bulk is normal Sensation: No response to noxious stimuli Coordination: Unable to assess Gait- deferred    Medications:  Scheduled: . chlorhexidine gluconate (MEDLINE KIT)  15 mL Mouth Rinse BID  . Chlorhexidine Gluconate Cloth  6 each Topical Daily  . docusate  100 mg Per Tube BID  . famotidine  20 mg Per Tube BID  . feeding supplement (PRO-STAT SUGAR FREE 64)  30 mL Per Tube BID  . feeding supplement (VITAL HIGH PROTEIN)  1,000 mL Per Tube Q24H  . folic acid  1 mg Per Tube Daily  . heparin  5,000 Units Subcutaneous Q8H  . insulin aspart  0-15 Units Subcutaneous Q4H  . insulin aspart  4 Units Subcutaneous Q4H  . insulin glargine  15 Units Subcutaneous Daily  . ipratropium-albuterol  3 mL Nebulization Q6H  . LORazepam  1 mg Intravenous Q4H  . mouth rinse  15 mL Mouth  Rinse 10 times per day  . sodium chloride flush  10-40 mL Intracatheter Q12H  . sodium chloride flush  10-40 mL Intracatheter Q12H  . thiamine injection  100 mg Intravenous Daily   Continuous: . sodium chloride Stopped (08/02/18 0939)  . sodium chloride 10 mL/hr at 08/03/18 1002  . sodium chloride Stopped (08/05/18 0752)  . levETIRAcetam Stopped (08/05/18 0022)  . piperacillin-tazobactam (ZOSYN)  IV 12.5 mL/hr at 08/05/18 0800  . propofol (DIPRIVAN) infusion Stopped (08/05/18 0824)   AYT:KZSWFU chloride, sodium chloride, acetaminophen, albuterol, bisacodyl, fentaNYL (SUBLIMAZE) injection, hydrALAZINE, Influenza vac split quadrivalent PF, midazolam, ondansetron (ZOFRAN) IV, pneumococcal 23 valent vaccine, sodium chloride flush, sodium chloride flush  Pertinent Labs/Diagnostics:   Mr Brain Wo Contrast  Result Date: 08/04/2018 CLINICAL DATA:  Altered mental status.  Recent cardiac arrest. EXAM: MRI HEAD WITHOUT CONTRAST TECHNIQUE: Multiplanar, multiecho pulse sequences of the brain and surrounding structures were obtained without intravenous contrast. COMPARISON:  Head CT 08/14/2018. FINDINGS: BRAIN: There is abnormal diffusion restriction within both medial temporal lobes and at multiple locations within the cerebellum. There is a small amount of diffusion restriction in the left occipital lobe. The midline structures are normal. No midline shift or other mass effect. Mildly advanced atrophy for age. Susceptibility-sensitive sequences show no chronic microhemorrhage or superficial siderosis. VASCULAR: Major intracranial arterial and venous sinus flow voids are normal. SKULL AND UPPER CERVICAL  SPINE: Calvarial bone marrow signal is normal. There is no skull base mass. Visualized upper cervical spine and soft tissues are normal. SINUSES/ORBITS: Diffuse paranasal sinus opacification. The orbits are normal. IMPRESSION: 1. Multifocal abnormal diffusion restriction, predominantly within the mesial  temporal lobes and bilateral cerebellar hemispheres. This pattern is consistent with drug overdose, most typically heroin leukoencephalopathy. 2. No hemorrhage or mass effect. Electronically Signed   By: Ulyses Jarred M.D.   On: 08/04/2018 03:37   Dg Chest Port 1 View  Result Date: 08/04/2018 CLINICAL DATA:  Respiratory failure. EXAM: PORTABLE CHEST 1 VIEW COMPARISON:  Chest x-ray 08/03/2018 FINDINGS: The endotracheal tube is in good position, approximately 3 cm above the carina. The NG tube tip is in the fundal region the stomach. The cardiac silhouette, mediastinal and hilar contours are within normal limits and stable. Slight improved lung aeration. There is a persistent diffuse interstitial and airspace process but overall this has slightly improved since yesterday study. IMPRESSION: Support apparatus in good position without complicating features. Slight improved lung aeration since yesterday's study. Electronically Signed   By: Marijo Sanes M.D.   On: 08/04/2018 09:44   [place] Ekg Site Rite  Result Date: 08/04/2018 If Site Rite image not attached, placement could not be confirmed due to current cardiac rhythm.  Assessment:  57 yo M with pmhx of IDDM, bipolar disorder, and drug abuse found down on 11/4 after crack cocaine use, subsequent cardiac arrest requiring ~ 25 minutes ACLS with ROSC. Found to have severe metabolic derangements of DKA, hyperkalemia, and lactic acidosis who remains intubated on hospital day 4 with agitation requiring sedation. Patient does not follow commands but is moving all extremities spontaneously and brainstem reflexes are intact.  MRI brain demonstrates diffusion restriction in the bilateral hippocampi and cerebellar hemispheres. Findings could be multifactorial in the setting of his drug use and anoxic brain injury.  Mesial temporal lobes seizures cannot be excluded but LTM and 2 spot EEG have been negative for seizures.  Long discussion with wife and sister yesterday .  In the short term, will treat empirically for seizures to treat a  reversible cause of AMS.  Repeat MRI in 48-72 hours.  Long term prognosis and recovery remain unclear at this point.  This is because there are multiple potential contributors to his toxic metabolic encephalopathy.  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   Recommendations:  KEPPRA 500 BID Ativan can be changed to 54m q6h PRN for seizure like activity One more day of vEEG off of benzos and on reduced sedation to see if there is any evidence of seizures, or electrographic disturbance that can explain his AMS. MRI after EEG is discontinued - possibly Saturday Will follow   JLaurey Morale MSN, NP-C [facility] [phone] Attending Neurohospitalist Addendum Patient seen and examined with APP/Resident. Agree with the history and physical as documented above. Agree with the plan as documented, which I helped formulate. I have independently reviewed the chart, obtained history, review of systems and examined the patient.I have personally reviewed pertinent head/neck/spine imaging (CT/MRI). Please feel free to call with any questions. --- AAmie Portland MD [facility] Pager: [phone] If 7pm to 7am, please call on call as listed on AMION.   CRITICAL CARE ATTESTATION This patient is critically ill and at significant risk of neurological worsening, death and care requires constant monitoring of vital signs, hemodynamics, respiratory, and cardiac monitoring. I  spent 30  minutes of neurocritical care time performing neurological assessment, discussion with family, other specialists and medical decision making of high complexity in the care of  this patient.

## 2018-08-05 NOTE — Progress Notes (Signed)
vLTM EEG maint complete. No skin breakdown. Continue to monitor °

## 2018-08-05 NOTE — Progress Notes (Signed)
Pharmacy Antibiotic Note  Ruben Harmon is a 57 y.o. male  With aspiration  pneumonia.  Pharmacy has been consulted for zosyn dosing. Spoke with Dr. Vassie Loll and plans are to continue zosyn untial 10/9 -WBC= 12, tmax= 100.6, PCT= 1.89 (trend down) -CrCL ~ 60  Plan: -No zosyn dose changes needed -Stop zosyn after last dose on 08/06/18  Height: 5' 6.5" (168.9 cm) Weight: 177 lb 14.6 oz (80.7 kg) IBW/kg (Calculated) : 64.95  Temp (24hrs), Avg:99.2 F (37.3 C), Min:98.1 F (36.7 C), Max:100.9 F (38.3 C)  Recent Labs  Lab 08/17/2018 1949  08/06/2018 1957 08/27/2018 2203 08/01/2018 2329 08/01/18 0443  08/02/18 0353  08/02/18 2131 08/03/18 0115 08/03/18 0458 08/04/18 0427 08/05/18 0452  WBC 34.9*  --   --   --   --   --   --  22.6*  --   --   --  16.3* 12.9* 12.5*  CREATININE  --    < > 3.30* 3.99* 3.62*  --    < > 2.69*   < > 2.50* 2.40* 2.31* 1.95* 1.41*  LATICACIDVEN  --   --  14.33* 12.0* 7.7* 4.0*  --   --   --   --   --   --   --   --    < > = values in this interval not displayed.    Estimated Creatinine Clearance: 58.3 mL/min (A) (by C-G formula based on SCr of 1.41 mg/dL (H)).    No Known Allergies  Antimicrobials this admission: Vancomycin 11/3 >> 11/5 Zosyn 11/3 >>(11/9)  Dose adjustments this admission:   Microbiology results: 11/5 resp- yeast/final 11/3 blood x2- ngtd  Thank you for allowing pharmacy to be a part of this patient's care.  Harland German, PharmD Clinical Pharmacist **Pharmacist phone directory can now be found on [email or web address] ([email or web address]).  Listed under [facility].

## 2018-08-05 NOTE — Progress Notes (Signed)
Pt failed SBT 5/5 due to increased RR>40.  PS increased to 15 pt tolerating well at this time.  RT will continue to monitor.

## 2018-08-05 NOTE — Progress Notes (Signed)
Pt placed back on full support due to increased work of breathing and increased respiratory rate.  RT will continue to monitor.  

## 2018-08-05 NOTE — Progress Notes (Signed)
57 year old IDDM with insulin pump, admitted 11/3 with cardiac arrest and DKA, was recently discharged from rehab for substance abuse, urine toxicology positive for cocaine. Shock resolved Echo shows that LV function is adequate. Renal function is improving.  MRI showed restricted diffusion in both hippocampi and cerebellar hemispheres consistent with substance abuse, anoxia or seizures since there was also mesial temporal involvement   Low-grade febrile On exam -remains nonspecifically agitated when propofol discontinued, upward gaze, does not follow commands, biting of ET tube, S1-S2 tacky, coarse rhonchi bilateral, 2+ edema.  Chest x-ray personally reviewed which shows improved bibasilar infiltrates. Labs show mild persistent leukocytosis, improving creatinine to 1.4, decreasing procalcitonin And mild thrombocytopenia.  Impression/plan Acute encephalopathy -2 spot EEGs have not shown any evidence of seizures.  LTM EEG only showed background slowing. Difficult exam for neuro prognostication but I think this is more and more pointing towards anoxic encephalopathy.    AKI-improving, diuresis with Lasix  Aspiration pneumonia with bilateral infiltrates-improved, continue Zosyn for 2 more days and stop 11/9  DKA resolved blood sugars remain uncontrolled, increase Lantus to 20 units  Acute respiratory failure-gets tachypneic on wake-up assessment.  DNR issued after discussion with wife and sister on 11/7, goal of care here would be full recovery otherwise comfort Would give him another day or 2 to prognosticate better but I am less optimistic today of favorable outcome  The patient is critically ill with multiple organ systems failure and requires high complexity decision making for assessment and support, frequent evaluation and titration of therapies, application of advanced monitoring technologies and extensive interpretation of multiple databases. Critical Care Time devoted to patient  care services described in this note independent of APP/resident  time is 35 minutes.   Comer Locket Vassie Loll MD

## 2018-08-05 NOTE — Procedures (Signed)
LTM-EEG Report  HISTORY: Continuous video-EEG monitoring performed for 57 year old with cardiac arrest.  ACQUISITION: International 10-20 system for electrode placement; 18 channels with additional eyes linked to ipsilateral ears and EKG. Additional T1-T2 electrodes were used. Continuous video recording obtained.   EEG NUMBER:  MEDICATIONS:  Day 1: see emr  DAY #1: from 1510 08/04/18 to 0730 08/05/18 BACKGROUND: An overall low voltage continuous recording with poor spontaneous variability and reactivity. The background consisted of low voltage 1-4 Hz activity bilaterally and sparse superimposed faster frequencies. There was no posterior dominant rhythm. Some state changes were observed with sleep spindles at times. Periods of low voltage attenuation were observed during sleep. EPILEPTIFORM/PERIODIC ACTIVITY: none SEIZURES: none EVENTS: none  EKG: no significant arrhythmia  SUMMARY: This was a moderately abnormal continuous video EEG due to loss of normal background features and generalized slowing. Mild reactivity and state changes were observed. There were no epileptiform discharges or seizures. This was indicative of a diffuse cerebral disturbance.

## 2018-08-06 DIAGNOSIS — G931 Anoxic brain damage, not elsewhere classified: Secondary | ICD-10-CM

## 2018-08-06 LAB — TRIGLYCERIDES: Triglycerides: 215 mg/dL — ABNORMAL HIGH (ref <150)

## 2018-08-06 LAB — GLUCOSE, CAPILLARY
GLUCOSE-CAPILLARY: 357 mg/dL — AB (ref 70–99)
GLUCOSE-CAPILLARY: 226 mg/dL — AB (ref 70–99)
GLUCOSE-CAPILLARY: 166 mg/dL — AB (ref 70–99)
Glucose-Capillary: 160 mg/dL — ABNORMAL HIGH (ref 70–99)
Glucose-Capillary: 164 mg/dL — ABNORMAL HIGH (ref 70–99)
Glucose-Capillary: 207 mg/dL — ABNORMAL HIGH (ref 70–99)
Glucose-Capillary: 194 mg/dL — ABNORMAL HIGH (ref 70–99)

## 2018-08-06 MED ORDER — ACETAMINOPHEN 160 MG/5ML PO SOLN: 650.0000 mg | ORAL | Status: DC | PRN

## 2018-08-06 NOTE — Procedures (Signed)
LTM-EEG Report  HISTORY: Continuous video-EEG monitoring performed for 57 year old with cardiac arrest.  ACQUISITION: International 10-20 system for electrode placement; 18 channels with additional eyes linked to ipsilateral ears and EKG. Additional T1-T2 electrodes were used. Continuous video recording obtained.   EEG NUMBER:  MEDICATIONS:  Day 2: see emr  DAY #2: from 0730 08/05/18 to 0730 08/06/18 BACKGROUND: An overall low voltage continuous recording with poor spontaneous variability and reactivity. The background consisted of low voltage 2-5 Hz activity bilaterally and sparse superimposed faster frequencies. There was no posterior dominant rhythm. Some state changes were observed with sleep spindles at times. EPILEPTIFORM/PERIODIC ACTIVITY: Some short runs of blunted GPDs were seen with 1Hz  frequency, no epileptiform appearance, and no ictal evolution SEIZURES: none EVENTS: none  EKG: no significant arrhythmia  SUMMARY: This was a moderately abnormal continuous video EEG due to loss of normal background features and generalized slowing. Reactivity was again observed with state changes. Some brief runs of non-epileptiform periodic discharges were observed. No seizures were seen.

## 2018-08-06 NOTE — Progress Notes (Signed)
Pt continues off of  Propofol gtt.  Spouse Emily at bedside.  Patient appears to attempt to open eyes to her voice and left great toe twitches when asked to move toes.  Is not able to follow commands to lift BUE/BLE.  Propofol gtt continues off.  Dr. Vassie Loll will reassess once patient is more alert.

## 2018-08-06 NOTE — Progress Notes (Signed)
Telephone call received from Onalee Hua, Organ Donation Coordinator.  Requested information provided.

## 2018-08-06 NOTE — Progress Notes (Signed)
Personal belongings locked in safe in ER retrieved by [name], RN and given to patient's brother [name].  Wallet and insulin pump part of possessions. Metal frame eyeglasses at bedside.

## 2018-08-06 NOTE — Progress Notes (Signed)
Arthur from [facility] at desk.  Information provided such as recent vital signs, gtts patient is on and meds being given PRN.  Updated on POC.

## 2018-08-06 NOTE — Progress Notes (Signed)
Patient's HR increasing to 130 with core foley temp increasing as well.  Medicated per [facility] for pain and fever as patient is on Propofol gtt and pain cannot be ruled out as contributor to HR increase.  Will notify Dr. Vassie Loll who is making rounds.

## 2018-08-06 NOTE — Progress Notes (Deleted)
Informed consent obtained via telephone call to patient's daughter, Sierra Tucker at [phone] for blood and/or blood products.  Confirmed by this RN and Shanna, RN 

## 2018-08-06 NOTE — Progress Notes (Signed)
Dr. Vassie Loll at bedside to speak with patient's sister French Ana and brother Tammy Sours.  Propofol gtt turned off and Dr. Vassie Loll will come back once patient arouses for neuro exam.  Verbalized understanding of POC

## 2018-08-06 NOTE — Progress Notes (Signed)
LTM EEG D/C'd per Dr Wilford Corner. No skin breakdown noted.

## 2018-08-06 NOTE — Progress Notes (Signed)
57 year old IDDM with insulin pump, admitted 11/3 with cardiac arrest and DKA, was recently discharged from rehab for substance abuse, urine toxicology positive for cocaine. Shock resolved Echo shows that LV function is adequate. Renal function is improving.  MRI showed restricted diffusion in both hippocampi and cerebellar hemispheres consistent with substance abuse, anoxia or seizures since there was also mesial temporal involvement  Continues to be febrile, minimal secretions Examined 1 hour after propofol discontinued, again opens eyes, poor gaze does not follow commands, some flicker movement of left toe which appears to be continuous, less psychomotor agitation today, S1-S2 tacky, clear breath sounds bilateral.  Chest x-ray personally reviewed which shows left basilar infiltrate  Labs show decreased creatinine to 1.4, slight high triglycerides, mild leukocytosis.  Impression/plan Acute encephalopathy -no evidence of seizures, neuro prognostication difficult here but this is more and more suggestive of anoxic encephalopathy.  Fever/aspiration pneumonia with bilateral infiltrates-continue Zosyn, was to stop today but will continue for 7 days. Control fevers with Tylenol and ice packs as needed  DKA resolved, sugars much better controlled on Lantus 20 units  Acute respiratory failure-can proceed with spontaneous breathing trials.  Question need for repeat MRI at this point, goal here is to let family visit today and then extubate to comfort tomorrow regardless of accuracy of neuro prognostication. His quality of life due to diabetic sequelae was pretty poor.  Discussed with wife, brother and sister at bedside.  The patient is critically ill with multiple organ systems failure and requires high complexity decision making for assessment and support, frequent evaluation and titration of therapies, application of advanced monitoring technologies and extensive interpretation of multiple  databases. Critical Care Time devoted to patient care services described in this note independent of APP/resident  time is 32 minutes.   Cyril Mourning MD. Tonny Bollman. [facility] Pager [phone] If no response call [phone]   08/06/2018

## 2018-08-06 NOTE — Progress Notes (Signed)
Neurology Progress Note   S:// Seen and examined.  No acute overnight events reported.  Currently on sedation propofol 30/h.  O:// Current vital signs: BP (!) 132/58   Pulse (!) 111   Temp 99.9 F (37.7 C)   Resp (!) 26   Ht 5' 6.5" (1.689 m)   Wt 77.1 kg   SpO2 100%   BMI 27.02 kg/m  Vital signs in last 24 hours: Temp:  [99.1 F (37.3 C)-101.1 F (38.4 C)] 99.9 F (37.7 C) (11/09 0753) Pulse Rate:  [94-125] 111 (11/09 0753) Resp:  [0-39] 26 (11/09 0753) BP: (107-194)/(56-94) 132/58 (11/09 0600) SpO2:  [96 %-100 %] 100 % (11/09 0753) FiO2 (%):  [40 %] 40 % (11/09 0753) Weight:  [77.1 kg] 77.1 kg (11/09 0400) General: Sedated with propofol 30 an hour, intubated.  Sedation was held for the exam. HEENT: Normocephalic atraumatic, endotracheal tube in place. Lungs: Clear to auscultation with no wheezing Cardiovascular: S1-S2 heard, tachycardic Abdomen: Soft nondistended nontender Extremities: Warm well perfused Neurological exam Mental status: Patient is sedated on propofol that was held for the duration of the exam.  Some spontaneous eye opening noted.  It also appeared that he probably opens eyes to noxious stimulation-sternal rub.  He did not follow any commands. No speech output. Cranial nerves: Breathing over the vent, pupils are 2 mm very sluggishly reactive bilaterally, corneal reflexes are present, cough and gag are present per nursing report, oculocephalics present.  Facial symmetry difficult to ascertain due to the endotracheal tube. Motor exam: Some spontaneous movement of right upper extremity and left lower extremity noted as I was interviewing the family but no withdrawal to noxious stimulation noted on formal exam. Sensory exam: As above Coordination cannot be tested Gait cannot be tested due to mental status.   Medications  Current Facility-Administered Medications:  .  0.9 %  sodium chloride infusion, , Intravenous, PRN, Omar Person, NP, Stopped at  08/02/18 [id number] .  0.9 %  sodium chloride infusion, , Intravenous, PRN, Omar Person, NP, Last Rate: 10 mL/hr at 08/03/18 1002 .  0.9 %  sodium chloride infusion, , Intravenous, Continuous, Corey Harold, NP, Stopped at 08/05/18 1531 .  acetaminophen (TYLENOL) tablet 650 mg, 650 mg, Oral, Q4H PRN, Collier Bullock, MD, 650 mg at 08/05/18 2117 .  albuterol (PROVENTIL) (2.5 MG/3ML) 0.083% nebulizer solution 2.5 mg, 2.5 mg, Nebulization, Q4H PRN, Omar Person, NP .  bisacodyl (DULCOLAX) suppository 10 mg, 10 mg, Rectal, Daily PRN, Collier Bullock, MD .  chlorhexidine gluconate (MEDLINE KIT) (PERIDEX) 0.12 % solution 15 mL, 15 mL, Mouth Rinse, BID, Collier Bullock, MD, 15 mL at 08/06/18 0744 .  Chlorhexidine Gluconate Cloth 2 % PADS 6 each, 6 each, Topical, Daily, Collier Bullock, MD, 6 each at 08/05/18 1035 .  docusate (COLACE) 50 MG/5ML liquid 100 mg, 100 mg, Per Tube, BID, Jennelle Human B, NP, 100 mg at 08/05/18 2117 .  famotidine (PEPCID) 40 MG/5ML suspension 20 mg, 20 mg, Per Tube, BID, Jennelle Human B, NP, 20 mg at 08/05/18 2117 .  feeding supplement (PRO-STAT SUGAR FREE 64) liquid 30 mL, 30 mL, Per Tube, BID, Corey Harold, NP, 30 mL at 08/05/18 2117 .  feeding supplement (VITAL HIGH PROTEIN) liquid 1,000 mL, 1,000 mL, Per Tube, Q24H, Collier Bullock, MD, 1,000 mL at 08/06/18 0438 .  fentaNYL (SUBLIMAZE) injection 100 mcg, 100 mcg, Intravenous, Q2H PRN, Corey Harold, NP, 100 mcg at 08/06/18 0525 .  folic acid (FOLVITE) tablet 1 mg,  1 mg, Per Tube, Daily, Corey Harold, NP, 1 mg at 08/05/18 1035 .  heparin injection 5,000 Units, 5,000 Units, Subcutaneous, Q8H, Collier Bullock, MD, 5,000 Units at 08/06/18 0508 .  hydrALAZINE (APRESOLINE) injection 10-20 mg, 10-20 mg, Intravenous, Q4H PRN, Rigoberto Noel, MD, 20 mg at 08/06/18 0508 .  Influenza vac split quadrivalent PF (FLUARIX) injection 0.5 mL, 0.5 mL, Intramuscular, Prior to discharge, Rigoberto Noel, MD .  insulin  aspart (novoLOG) injection 0-15 Units, 0-15 Units, Subcutaneous, Q4H, Jennelle Human B, NP, 3 Units at 08/06/18 0437 .  insulin aspart (novoLOG) injection 6 Units, 6 Units, Subcutaneous, Q4H, Jennelle Human B, NP, 6 Units at 08/06/18 0437 .  insulin glargine (LANTUS) injection 20 Units, 20 Units, Subcutaneous, Daily, Elsworth Soho, Rakesh V, MD .  ipratropium-albuterol (DUONEB) 0.5-2.5 (3) MG/3ML nebulizer solution 3 mL, 3 mL, Nebulization, Q6H, Omar Person, NP, 3 mL at 08/06/18 0758 .  [COMPLETED] levETIRAcetam (KEPPRA) IVPB 1000 mg/100 mL premix, 1,000 mg, Intravenous, Once, Stopped at 08/04/18 1402 **AND** levETIRAcetam (KEPPRA) IVPB 500 mg/100 mL premix, 500 mg, Intravenous, Q12H, Velna Ochs, MD, Stopped at 08/06/18 0057 .  LORazepam (ATIVAN) injection 1 mg, 1 mg, Intravenous, Q8H PRN, Amie Portland, MD .  MEDLINE mouth rinse, 15 mL, Mouth Rinse, 10 times per day, Collier Bullock, MD, 15 mL at 08/06/18 0508 .  midazolam (VERSED) injection 2 mg, 2 mg, Intravenous, Q2H PRN, Collier Bullock, MD, 2 mg at 08/05/18 0926 .  ondansetron (ZOFRAN) injection 4 mg, 4 mg, Intravenous, Q6H PRN, Collier Bullock, MD .  piperacillin-tazobactam (ZOSYN) IVPB 3.375 g, 3.375 g, Intravenous, Q8H, Rigoberto Noel, MD, Stopped at 08/06/18 0509 .  pneumococcal 23 valent vaccine (PNU-IMMUNE) injection 0.5 mL, 0.5 mL, Intramuscular, Prior to discharge, Rigoberto Noel, MD .  propofol (DIPRIVAN) 1000 MG/100ML infusion, 0-50 mcg/kg/min, Intravenous, Titrated, Einar Grad, RPH, Last Rate: 15.07 mL/hr at 08/06/18 0600, 30 mcg/kg/min at 08/06/18 0600 .  sodium chloride flush (NS) 0.9 % injection 10-40 mL, 10-40 mL, Intracatheter, Q12H, Collier Bullock, MD, 10 mL at 08/05/18 2119 .  sodium chloride flush (NS) 0.9 % injection 10-40 mL, 10-40 mL, Intracatheter, PRN, Collier Bullock, MD .  sodium chloride flush (NS) 0.9 % injection 10-40 mL, 10-40 mL, Intracatheter, Q12H, Rigoberto Noel, MD, 10 mL at 08/05/18 2118 .   sodium chloride flush (NS) 0.9 % injection 10-40 mL, 10-40 mL, Intracatheter, PRN, Rigoberto Noel, MD .  thiamine (B-1) injection 100 mg, 100 mg, Intravenous, Daily, Collier Bullock, MD, 100 mg at 08/05/18 1035 Labs CBC    Component Value Date/Time   WBC 12.5 (H) 08/05/2018 0452   RBC 3.18 (L) 08/05/2018 0452   HGB 10.1 (L) 08/05/2018 0452   HCT 29.8 (L) 08/05/2018 0452   PLT 138 (L) 08/05/2018 0452   MCV 93.7 08/05/2018 0452   MCH 31.8 08/05/2018 0452   MCHC 33.9 08/05/2018 0452   RDW 14.6 08/05/2018 0452   LYMPHSABS 3.8 08/19/2018 1949   MONOABS 1.4 (H) 07/30/2018 1949   EOSABS 0.0 08/25/2018 1949   BASOSABS 0.0 08/13/2018 1949    CMP     Component Value Date/Time   NA 142 08/05/2018 0452   K 3.9 08/05/2018 0452   CL 106 08/05/2018 0452   CO2 25 08/05/2018 0452   GLUCOSE 290 (H) 08/05/2018 0452   BUN 41 (H) 08/05/2018 0452   CREATININE 1.41 (H) 08/05/2018 0452   CALCIUM 8.0 (L) 08/05/2018 0452   PROT 5.0 (L) 08/04/2018 0427   ALBUMIN  2.1 (L) 08/05/2018 0452   AST 194 (H) 08/04/2018 0427   ALT 132 (H) 08/04/2018 0427   ALKPHOS 225 (H) 08/04/2018 0427   BILITOT 1.1 08/04/2018 0427   GFRNONAA 54 (L) 08/05/2018 0452   GFRAA >60 08/05/2018 0452    glycosylated hemoglobin  Lipid Panel     Component Value Date/Time   CHOL 77 08/10/2018 2203   TRIG 108 08/03/2018 1051   HDL 34 (L) 08/01/2018 2203   CHOLHDL 2.3 08/14/2018 2203   VLDL 22 08/13/2018 2203   LDLCALC 21 08/14/2018 2203   Imaging I have reviewed images in epic and the results pertinent to this consultation are: MRI examination of the brain-bilateral hippocampal and cerebellar hyperintensities concerning for changes seen with drug overdoses as well as hypoxia.  Long-term EEG has been on for 2 days. Today's report as below SUMMARY: This wasa moderately abnormal continuous video EEG due to loss of normal background features and generalized slowing. Reactivity was again observed with state changes. Some  brief runs of non-epileptiform periodic discharges were observed. No seizures were seen.   Assessment:  57 year old man with past medical history of insulin-dependent diabetes, bipolar disorder and substance abuse, found down on 11 4 after crack cocaine use as evidenced by a positive urine drug screen, subsequent cardiac arrest requiring approximately 25 minutes of ACLS prior to return of spontaneous circulation, found to have severe metabolic derangements including DKA, hyperkalemia and lactic acidosis who remains intubated and neurology was consulted because upon trying to lighten sedation, he becomes agitated and his exam has been nonpurposeful. Today, he has not shown any purposeful responses to exam but has shown some spontaneous movement in his extremities. MRI of the brain demonstrates restricted diffusion in bilateral hippocampi and cerebellar hemispheres-findings that could have drug use versus anoxic brain injury as an etiology. Because of the involvement of the mesial temporal lobes and hippocampi, seizures were a concern but the spot EEGs and 2 days of long-term video EEGs have been negative for seizures. He is on Keppra 500 twice daily.  He was on benzodiazepines standing doses which have not been changed to PRN's. I would like to get an exam off of sedation and also repeat MRI. Absence of a meaningful exam of her sedation beyond 48 to 72 hours after the cardiac arrest, usually portends poor prognosis for neurologically meaningful recovery, which I have communicated to the family.  The family is very understanding of the situation.  His wife is a Designer, jewellery and said that he would not want prolonged life-sustaining measures if he does not have a good quality of life- at baseline his quality of life is poor due to the complicated of diabetes to begin with.  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  I discussed my plan in detail with his brother-was at bedside, who has flown in from [place] just this morning.  I answered all the questions to the best of my ability.  Please call with questions.  -- Amie Portland, MD [facility] Pager: [phone] If 7pm to 7am, please call on call as listed on AMION.  CRITICAL CARE ATTESTATION This patient is critically ill and at significant risk of neurological worsening, death and care requires constant monitoring of vital signs, hemodynamics, respiratory, and cardiac monitoring. I spent 30  minutes of  neurocritical care time performing neurological assessment, discussion with family, other specialists and medical decision making of high complexity in the care of  this patient.

## 2018-08-06 NOTE — Progress Notes (Signed)
EEG leads removed by EEG tech as ordered by neurology.

## 2018-08-06 NOTE — Progress Notes (Signed)
Per Merton Border with [facility], patient is not a candidate for organ donation.  Call with time and date of death.

## 2018-08-06 NOTE — Progress Notes (Signed)
Referral made to [facility] per protocol and spoke to [employer]  Referral # [id number].

## 2018-08-07 LAB — BASIC METABOLIC PANEL
Anion gap: 8 (ref 5–15)
BUN: 44 mg/dL — ABNORMAL HIGH (ref 6–20)
CALCIUM: 8.5 mg/dL — AB (ref 8.9–10.3)
CHLORIDE: 111 mmol/L (ref 98–111)
CO2: 27 mmol/L (ref 22–32)
CREATININE: 1.23 mg/dL (ref 0.61–1.24)
GFR calc Af Amer: >60 mL/min (ref >60)
GFR calc non Af Amer: >60 mL/min (ref >60)
GLUCOSE: 275 mg/dL — AB (ref 70–99)
Potassium: 3.9 mmol/L (ref 3.5–5.1)
Sodium: 146 mmol/L — ABNORMAL HIGH (ref 135–145)

## 2018-08-07 LAB — GLUCOSE, CAPILLARY
Glucose-Capillary: 184 mg/dL — ABNORMAL HIGH (ref 70–99)
Glucose-Capillary: 210 mg/dL — ABNORMAL HIGH (ref 70–99)

## 2018-08-07 LAB — CBC
HEMATOCRIT: 32.9 % — AB (ref 39.0–52.0)
HEMOGLOBIN: 10.2 g/dL — AB (ref 13.0–17.0)
MCH: 29.4 pg (ref 26.0–34.0)
MCHC: 31 g/dL (ref 30.0–36.0)
MCV: 94.8 fL (ref 80.0–100.0)
Platelets: 221 10*3/uL (ref 150–400)
RBC: 3.47 MIL/uL — ABNORMAL LOW (ref 4.22–5.81)
RDW: 14.6 % (ref 11.5–15.5)
WBC: 12.3 10*3/uL — ABNORMAL HIGH (ref 4.0–10.5)
nRBC: 0 % (ref 0.0–0.2)

## 2018-08-07 MED ORDER — MORPHINE SULFATE (PF) 4 MG/ML IV SOLN
5.0000 mg | Freq: Once | INTRAVENOUS | Status: AC
  Administered 2018-08-07: 5 mg via INTRAVENOUS

## 2018-08-07 MED ORDER — ACETAMINOPHEN 325 MG PO TABS: 650.0000 mg | ORAL_TABLET | Freq: Four times a day (QID) | ORAL | Status: DC | PRN

## 2018-08-07 MED ORDER — MIDAZOLAM HCL (PF) 5 MG/ML IJ SOLN
5.0000 mg | Freq: Once | INTRAMUSCULAR | Status: AC
  Administered 2018-08-07: 5 mg via INTRAVENOUS

## 2018-08-07 MED ORDER — GLYCOPYRROLATE 0.2 MG/ML IJ SOLN: 0.2000 mg | INTRAMUSCULAR | Status: DC | PRN

## 2018-08-07 MED ORDER — MORPHINE SULFATE (PF) 4 MG/ML IV SOLN
INTRAVENOUS | Status: AC
  Filled 2018-08-07: qty 2

## 2018-08-07 MED ORDER — MIDAZOLAM HCL 2 MG/2ML IJ SOLN
INTRAMUSCULAR | Status: AC
  Filled 2018-08-07: qty 6

## 2018-08-07 MED ORDER — SODIUM CHLORIDE 0.9 % IV SOLN
1.0000 mg/h | INTRAVENOUS | Status: DC
  Filled 2018-08-07: qty 10

## 2018-08-07 MED ORDER — MORPHINE 100MG IN NS 100ML (1MG/ML) PREMIX INFUSION
1.0000 mg/h | INTRAVENOUS | Status: DC
  Administered 2018-08-07: 5 mg/h via INTRAVENOUS
  Filled 2018-08-07: qty 100

## 2018-08-07 MED ORDER — GLYCOPYRROLATE 0.2 MG/ML IJ SOLN
0.2000 mg | INTRAMUSCULAR | Status: DC | PRN
  Administered 2018-08-07: 0.2 mg via INTRAVENOUS
  Filled 2018-08-07: qty 1

## 2018-08-07 MED ORDER — MIDAZOLAM BOLUS VIA INFUSION (WITHDRAWAL LIFE SUSTAINING TX)
2.0000 mg | INTRAVENOUS | Status: DC | PRN
  Administered 2018-08-07 (×6): 2 mg via INTRAVENOUS
  Filled 2018-08-07: qty 2

## 2018-08-07 MED ORDER — GLYCOPYRROLATE 1 MG PO TABS
1.0000 mg | ORAL_TABLET | ORAL | Status: DC | PRN
  Filled 2018-08-07: qty 1

## 2018-08-07 MED ORDER — ACETAMINOPHEN 650 MG RE SUPP: 650.0000 mg | Freq: Four times a day (QID) | RECTAL | Status: DC | PRN

## 2018-08-07 MED ORDER — MIDAZOLAM HCL 2 MG/2ML IJ SOLN
INTRAMUSCULAR | Status: AC
  Administered 2018-08-07: 5 mg via INTRAVENOUS
  Filled 2018-08-07: qty 6

## 2018-08-07 MED ORDER — DEXTROSE 5 % IV SOLN: INTRAVENOUS | Status: DC

## 2018-08-07 MED ORDER — MORPHINE SULFATE (PF) 2 MG/ML IV SOLN
INTRAVENOUS | Status: AC
  Administered 2018-08-07: 2 mg via INTRAVENOUS
  Filled 2018-08-07: qty 1

## 2018-08-07 MED ORDER — DIPHENHYDRAMINE HCL 50 MG/ML IJ SOLN: 25.0000 mg | INTRAMUSCULAR | Status: DC | PRN

## 2018-08-07 MED ORDER — MIDAZOLAM HCL 2 MG/2ML IJ SOLN: 5.0000 mg | Freq: Once | INTRAMUSCULAR | Status: DC

## 2018-08-07 MED ORDER — MORPHINE SULFATE (PF) 2 MG/ML IV SOLN: 2.0000 mg | INTRAVENOUS | Status: DC | PRN

## 2018-08-07 MED ORDER — MORPHINE 100MG IN NS 100ML (1MG/ML) PREMIX INFUSION
0.0000 mg/h | INTRAVENOUS | Status: DC
  Administered 2018-08-07 (×2): 20 mg/h via INTRAVENOUS
  Filled 2018-08-07: qty 100

## 2018-08-07 MED ORDER — MORPHINE SULFATE (PF) 4 MG/ML IV SOLN: 5.0000 mg | Freq: Once | INTRAVENOUS | Status: DC

## 2018-08-07 MED ORDER — MORPHINE SULFATE (PF) 2 MG/ML IV SOLN: 1.0000 mg | INTRAVENOUS | Status: DC | PRN

## 2018-08-07 MED ORDER — POLYVINYL ALCOHOL 1.4 % OP SOLN: 1.0000 [drp] | Freq: Four times a day (QID) | OPHTHALMIC | Status: DC | PRN

## 2018-08-07 MED ORDER — SODIUM CHLORIDE 0.9 % IV SOLN
0.0000 mg/h | INTRAVENOUS | Status: DC
  Filled 2018-08-07: qty 20

## 2018-08-07 MED ORDER — MORPHINE SULFATE (PF) 2 MG/ML IV SOLN
2.0000 mg | INTRAVENOUS | Status: DC | PRN
  Administered 2018-08-07 (×2): 2 mg via INTRAVENOUS
  Filled 2018-08-07: qty 1

## 2018-08-07 MED ORDER — SODIUM CHLORIDE 0.9 % IV SOLN
0.0000 mg/h | INTRAVENOUS | Status: DC
  Administered 2018-08-07: 4 mg/h via INTRAVENOUS
  Administered 2018-08-07: 10 mg/h via INTRAVENOUS
  Filled 2018-08-07: qty 10

## 2018-08-07 MED ORDER — MIDAZOLAM HCL 2 MG/2ML IJ SOLN
5.0000 mg | Freq: Once | INTRAMUSCULAR | Status: AC
  Administered 2018-08-07: 5 mg via INTRAVENOUS

## 2018-08-07 MED ORDER — MORPHINE BOLUS VIA INFUSION
5.0000 mg | INTRAVENOUS | Status: DC | PRN
  Administered 2018-08-07 (×5): 5 mg via INTRAVENOUS
  Filled 2018-08-07: qty 5

## 2018-08-07 MED ORDER — MORPHINE SULFATE (PF) 4 MG/ML IV SOLN
INTRAVENOUS | Status: AC
  Administered 2018-08-07: 5 mg via INTRAVENOUS
  Filled 2018-08-07: qty 2

## 2018-08-07 MED ORDER — MIDAZOLAM HCL 2 MG/2ML IJ SOLN: 1.0000 mg | INTRAMUSCULAR | Status: DC | PRN

## 2018-08-07 MED ORDER — RACEPINEPHRINE HCL 2.25 % IN NEBU
INHALATION_SOLUTION | RESPIRATORY_TRACT | Status: AC
  Administered 2018-08-07: 2.25 mL via RESPIRATORY_TRACT
  Filled 2018-08-07: qty 0.5

## 2018-08-08 LAB — GLUCOSE, CAPILLARY: GLUCOSE-CAPILLARY: 167 mg/dL — AB (ref 70–99)

## 2018-08-28 NOTE — Procedures (Signed)
Extubation Procedure Note  Patient Details:   Name: Ruben Harmon DOB: 1961-01-06 MRN: [id number]   Airway Documentation:    Vent end date: 2018-08-10 Vent end time: 1115   Evaluation  O2 sats: stable throughout Complications: No apparent complications Patient did tolerate procedure well. Bilateral Breath Sounds: Clear, Diminished   Pt extubated per MD order.  Family at bedside. Pt placed on 4L [place].  Cherylin Mylar 08-10-2018, 11:18 AM

## 2018-08-28 NOTE — Progress Notes (Signed)
Propofol gtt turned off and Dr. Vassie Loll made aware.

## 2018-08-28 NOTE — Progress Notes (Signed)
PICC line to right upper arm, left AC 18G PIV, and indwelling urinary catheter, rectal probe all removed per policy.

## 2018-08-28 NOTE — Progress Notes (Signed)
Patient in asystole, without heart tones, pulses, or respirations.  Time of death confirmed by this nurse and Josefa Half, RN.  Family at bedside tearful and appropriate.  Comfort measures provided.

## 2018-08-28 NOTE — Progress Notes (Signed)
Dr. Vassie Loll notified of patient's passing.

## 2018-08-28 NOTE — Plan of Care (Signed)
  Problem: Clinical Measurements: Goal: Will remain free from infection Outcome: Progressing Goal: Respiratory complications will improve Outcome: Progressing   Problem: Nutrition: Goal: Adequate nutrition will be maintained Outcome: Progressing   Problem: Skin Integrity: Goal: Risk for impaired skin integrity will be minimized. Outcome: Progressing   Problem: Education: Goal: Knowledge of General Education information will improve Description Including pain rating scale, medication(s)/side effects and non-pharmacologic comfort measures Outcome: Not Progressing   Problem: Activity: Goal: Risk for activity intolerance will decrease Outcome: Not Progressing Note:  Pt intubated and sedated

## 2018-08-28 NOTE — Progress Notes (Signed)
Witnessed Barbie Banner, RN waste 95ml from Morphine gtt, 45ml from Versed gtt, and from an unused Versed gtt bag per policy.

## 2018-08-28 NOTE — Progress Notes (Signed)
Spoke to [place], Charity fundraiser at [facility] to advise patient comfort care.

## 2018-08-28 NOTE — Progress Notes (Addendum)
NAME:  Ruben Harmon, MRN:  [id number], DOB:  [birth year], LOS: 7 ADMISSION DATE:  07/30/2018, CONSULTATION DATE:  11/3 REFERRING MD:  Otho Najjar, CHIEF COMPLAINT:  Cardiac Arrest    History of Present Illness   57 year old IDDM with insulin pump & history of  drug abuse recently out of rehab found down 11/3 and suffered 10 minute asystolic arrest. Started on TTM 36 and admitted to ICU.  urine toxicology positive for cocaine. Shock resolved   Past Medical History  Tobacco Use, Cocaine Use, HTN    Significant Hospital Events   11/3 > Presents to ED  11/4 off pressors, starting to respond some. On 36TTM requiring very low water temps to maintain.   11/6 off pressors, precedex changed to propofol, failed SBT 2/2 ongoing   Consults: date of consult/date signed off & final recs:  PCCM 11/3   Procedures (surgical and bedside):  ETT 11/3 >11/10 Right Femoral CVC 11/3 >  out  Right Femoral Aline 11/3 > out Foley 11/3 >> PICC 11/6  Significant Diagnostic Tests:  CT Head/C-Spine 11/4 > No acute intracranial abnormalities.  Fluid in the maxillary sinuses. No fracture or traumatic malalignment in the cervical spine. Mild ground-glass in the left lung apex is likely infectious or inflammatory. CT Chest/A 11/4> Patchy airspace infiltrates throughout both lungs with consolidation in the lung bases. This could represent edema, pneumonia, aspiration, or contusion. No acute process demonstrated in the abdomen or pelvis. Diffuse fatty infiltration of the liver. Enlarged prostate gland. Echo 11/4 >> LVEF 65-70%. Normal wall motion. Grade 1 DD.  EEG 11/4 > > abnormal, background slowing. Notably, the patient was on hefty doses of sedation  At the time of exam. No epileptiform activity noted.   11/7 MRI brain >>  Multifocal abnormal diffusion restriction, predominantly within the mesial temporal lobes and bilateral cerebellar hemispheres. 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   Micro Data:  11/3 MRSA PCR >> negative Blood 11/3 >> ngtd x 3 days Tracheal Asp 11/3 >> few yeast   Antimicrobials:  Zosyn 11/4 >>  Vancomycin 11/4 >>  11/4  Subjective/Interval:  Defervesced. Orally intubated  Objective   Blood pressure (!) 141/54, pulse 100, temperature 99.5 F (37.5 C), temperature source Rectal, resp. rate 20, height 5' 6.5" (1.689 m), weight 79.5 kg, SpO2 100 %.    Vent Mode: PRVC FiO2 (%):  [40 %] 40 % Set Rate:  [20 bmp] 20 bmp Vt Set:  [510 mL] 510 mL PEEP:  [5 cmH20] 5 cmH20 Plateau Pressure:  [16 cmH20-29 cmH20] 16 cmH20   Intake/Output Summary (Last 24 hours) at 09/04/18 1124 Last data filed at 04-Sep-2018 1019 Gross per 24 hour  Intake 1317.3 ml  Output 2250 ml  Net -932.7 ml   Filed Weights   08/05/18 0500 08/06/18 0400 09/04/2018 0500  Weight: 80.7 kg 77.1 kg 79.5 kg    Examination:  Critically ill, intubated, middle-aged man Examined off sedation, follows one-step commands today!  Stick out his tongue and moved extremities to command. Clear breath sounds bilateral S1-S2 tacky Soft nontender abdomen 1+ edema  Chest x-ray 11/9 personally reviewed, no new infiltrates  Resolved Hospital Problem list   Anion gap acidosis, lactic acidosis/DKA  Assessment & Plan:   Acute hypercarbic respiratory failure s/p cardiac arrest.  -He tolerated spontaneous breathing trial with pressure support 5/5 and was extubated. He developed upper airway wheezing without obvious stridor, given racemic epi Goal would be comfort if he develops  distress but will see if we can get him through this   Asystolic Cardiac Arrest (Witnessed 10 minutes) with concern for hypoxic/anoxic injury. Etiology is suspected to be drug induced given history and witness reports. Troponin peaked 11/4 at 2.61. EKG with no evidence of ischemia. EEG and CT head negative 11/4.    Acute encephalopathy-no evidence of seizures, discontinue  Keppra Improved neuro prognosis based on exam  Septic vs Hypovolemic Shock vs sedation needs- resolved     DM1 managed on insulin pump at home, but recent compliance has been poor.  - ct lantus to 20  units daily  - SSI increase to moderate  - CBG q 4  Acute Kidney Injury:  - UOP stable and sCr continues to improve - trend renal panel  Hypokalemia- resolved Hypophos -repleted  Probable aspiration pneumonia Leukocytosis - improving   - continue zosyn x 10ds total  Substance abuse H/O Tobacco Use - continue daily Thiamine, folate.   Transaminitis, improving  - trend LFTs  Disposition / Summary of Today's Plan 08/13/2018    He cleared spontaneous breathing trials and was extubated but developed respiratory distress. Goal would be comfort.  Discussed with wife, brother at bedside.  His quality of life due to diabetic sequelae was pretty poor and he would not want to live with significant disability.  No reintubation clarified  He required several boluses of morphine and Versed at the bedside to get him comfortable and then was placed on drips which will be titrated to comfort    Code Status: DNR Family Communication: wife, brother   The patient is critically ill with multiple organ systems failure and requires high complexity decision making for assessment and support, frequent evaluation and titration of therapies, application of advanced monitoring technologies and extensive interpretation of multiple databases. Critical Care Time devoted to patient care services described in this note independent of APP/resident  time is 45 minutes.    Cyril Mourning MD. Tonny Bollman. [facility] Pager [phone] If no response call [phone]    08/01/2018, 11:24 AM

## 2018-08-28 NOTE — Progress Notes (Signed)
After extubation, patient became tachypneic and displayed air hunger, shortness of breath, and upper airway stridor.  Dr. Vassie Loll at bedside and new orders received and noted.

## 2018-08-28 NOTE — Progress Notes (Signed)
Patient being transferred to the [facility] via Pharmacist, community by Charlie Pitter, CNA.  Patient and body bag labeled per policy.

## 2018-08-28 NOTE — Progress Notes (Signed)
Telephone call received from [facility] who asked if we had blood from the patient available from date of admission.  Called lab with Ruben Harmon on the phone and advised him that the only blood available was three tubes from yesterday and today.  2 green tubes and one lavender.  Ruben Harmon is going to call [place] and then call me back.

## 2018-08-28 NOTE — Progress Notes (Signed)
Spiritual care consult discontinued per spouse request not to have them come see the patient.

## 2018-08-28 NOTE — Progress Notes (Addendum)
This nurse was advised that per "bed control" patient  "may be a medical examiner's case.  Called ME at [phone].  Spoke to [employer] and he advised he would have to call [place] and call me back to determine if the patient is in fact a ME case.  ME advised that all lines had been removed due to not knowing this may be a potential ME case.  Verbalized understanding.

## 2018-08-28 NOTE — Progress Notes (Signed)
Telephone call made to [facility] to notify of time of death.  Spoke to [employer] who advised patient may be suitable for tissues and eye research

## 2018-08-28 NOTE — Progress Notes (Signed)
Telephone call placed to [facility] in lab to notify her that patient is a ME case and to please keep blood tubes available for ME.  Verbalized understanding.

## 2018-08-28 NOTE — Progress Notes (Signed)
Family confirmed that patient will be cremated and under the care of [place] and [employer] in [place], [place].

## 2018-08-28 NOTE — Progress Notes (Signed)
Post extubation, patient developed severe respiratory distress, O2 via nasal cannula was increased to 6L.  Goal of care was transferred to comfort care.  See MAR for PRN meds and gtts hung per dr. Vassie Loll.  Spouse, Neena Rhymes, and friend Barbara Cower at bedside and in agreement of POC.

## 2018-08-28 NOTE — Progress Notes (Signed)
This nurse and Ruben Fabian, RN wasted 95ml from Morphine gtt, 45ml from Versed gtt, and from an unused Versed gtt bag per policy.

## 2018-08-28 NOTE — Progress Notes (Signed)
Spouse and brother of patient at bedside.  Updated on POC to turn off Propofol gtt and then Dr. Vassie Loll will come reassess patient's neurological status. Family will determine at that time when they would like to extubate the patient.

## 2018-08-28 NOTE — Discharge Summary (Signed)
NAME:  Ruben Harmon, MRN:  [id number], DOB:  Nov 24, 1960, LOS: 7 ADMISSION DATE:  08/09/2018, CONSULTATION DATE:  11/3 REFERRING MD:  Otho Najjarozier, CHIEF COMPLAINT:  Cardiac Arrest    History of Present Illness   57 year old IDDM with insulin pump & history of  drug abuse recently out of rehab found down 11/3 and suffered 10 minute asystolic arrest. Started on TTM 36 and admitted to ICU.  urine toxicology positive for cocaine. Shock resolved   Past Medical History  Tobacco Use, Cocaine Use, HTN    Significant Hospital Events   11/3 > Presents to ED  11/4 off pressors, starting to respond some. On 36TTM requiring very low water temps to maintain.   11/6 off pressors, precedex changed to propofol, failed SBT 2/2 ongoing   Consults: date of consult/date signed off & final recs:  PCCM 11/3   Procedures (surgical and bedside):  ETT 11/3 >11/10 Right Femoral CVC 11/3 >  out  Right Femoral Aline 11/3 > out Foley 11/3 >> PICC 11/6  Significant Diagnostic Tests:  CT Head/C-Spine 11/4 > No acute intracranial abnormalities.  Fluid in the maxillary sinuses. No fracture or traumatic malalignment in the cervical spine. Mild ground-glass in the left lung apex is likely infectious or inflammatory. CT Chest/A 11/4> Patchy airspace infiltrates throughout both lungs with consolidation in the lung bases. This could represent edema, pneumonia, aspiration, or contusion. No acute process demonstrated in the abdomen or pelvis. Diffuse fatty infiltration of the liver. Enlarged prostate gland. Echo 11/4 >> LVEF 65-70%. Normal wall motion. Grade 1 DD.  EEG 11/4 > > abnormal, background slowing. Notably, the patient was on hefty doses of sedation  At the time of exam. No epileptiform activity noted.   11/7 MRI brain >>  Multifocal abnormal diffusion restriction, predominantly within the mesial temporal lobes and bilateral cerebellar hemispheres. 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   Micro Data:  11/3 MRSA PCR >> negative Blood 11/3 >> ngtd x 3 days Tracheal Asp 11/3 >> few yeast   Antimicrobials:  Zosyn 11/4 >>  Vancomycin 11/4 >>  11/4   Resolved Hospital Problem list   Anion gap acidosis, lactic acidosis/DKA  Assessment & Plan:   Acute hypercarbic respiratory failure s/p cardiac arrest.  -He tolerated spontaneous breathing trial with pressure support 5/5 and was extubated. He developed upper airway wheezing without obvious stridor, given racemic epi Goal would be comfort if he develops  distress but will see if we can get him through this   Asystolic Cardiac Arrest (Witnessed 10 minutes) with concern for hypoxic/anoxic injury. Etiology is suspected to be drug induced given history and witness reports. Troponin peaked 11/4 at 2.61. EKG with no evidence of ischemia. EEG and CT head negative 11/4.    Acute encephalopathy-no evidence of seizures, discontinue Keppra Improved neuro prognosis based on exam  Septic vs Hypovolemic Shock vs sedation needs- resolved     DM1 managed on insulin pump at home, but recent compliance has been poor.  - ct lantus to 20  units daily  - SSI increase to moderate  - CBG q 4  Acute Kidney Injury:  - UOP stable and sCr continues to improve - trend renal panel  Hypokalemia- resolved Hypophos -repleted  Probable aspiration pneumonia Leukocytosis - improving   - continue zosyn x 10ds total  Substance abuse H/O Tobacco Use - continue daily Thiamine, folate.   Transaminitis, improving  - trend LFTs  COURSE - He cleared  spontaneous breathing trials and was extubated but developed respiratory distress. Goal would be comfort.  Discussed with wife, brother at bedside.  His quality of life due to diabetic sequelae was pretty poor and he would not want to live with significant disability.  No reintubation clarified  He required several boluses of morphine and Versed at the bedside to get  him comfortable and then was placed on drips, titrated to comfort  Cause of death-acute respiratory failure, DKA, IDDM  Cyril Mourning MD. FCCP. [facility]   08/09/2018      08/09/2018, 9:02 AM

## 2018-08-28 NOTE — Progress Notes (Signed)
Spouse came out of room to tell me that the patient was alert and following commands.  Dr. Vassie Loll came to bedside and patient moving legs around in bed.  Able to nod "yes" to simple questions but not "no". Placed onto CPAP trial by Dr. Vassie Loll and patient [id number].  Stayed on CPAP trial for approximately 20 minutes.  Order received to extubate.

## 2018-08-28 NOTE — Progress Notes (Signed)
Patient's spouse Issaic Welliver and brother Titus Drone expressed sincere gratitude and appreciation care received from care team including this nurse and Dr. Vassie Loll.

## 2018-08-28 DEATH — deceased

## 2019-08-31 IMAGING — MR MR HEAD W/O CM
10 of 11 series · 42 of 48 positions shown · non-contrast
Comparison: Head CT 07/31/2018.

CLINICAL DATA: Altered mental status.  Recent cardiac arrest.

EXAM:
MRI HEAD WITHOUT CONTRAST
TECHNIQUE: Multiplanar, multiecho pulse sequences of the brain and surrounding
structures were obtained without intravenous contrast.

[Series 5: DWI · axial · 4.0mm · 0.88mm/px · z∈[-45,+98]mm · 7 of 74 slices shown (1 of 4)]
[im 1/74]
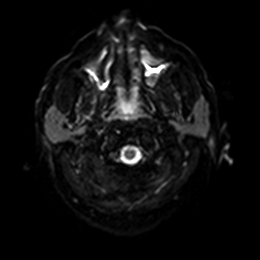
[im 13/74]
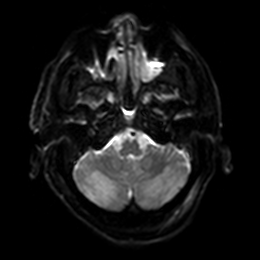
[im 25/74]
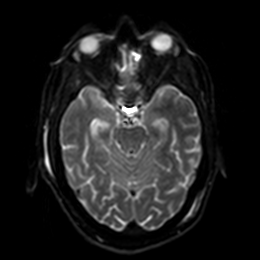
[im 37/74]
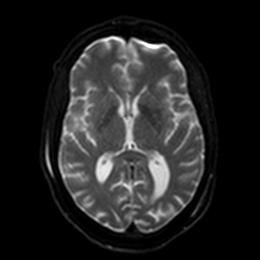
[im 49/74]
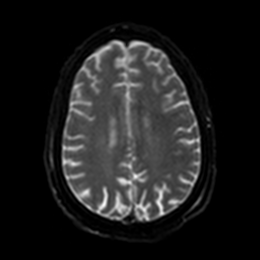
[im 61/74]
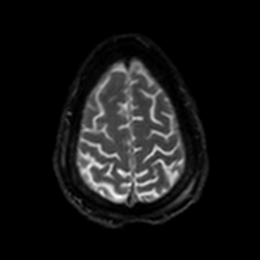
[im 74/74]
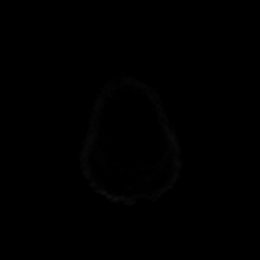

[Series 6: DWI · axial · 4.0mm · 0.88mm/px · z∈[-45,+98]mm · 4 of 37 slices shown (2 of 4)]
[im 1/37]
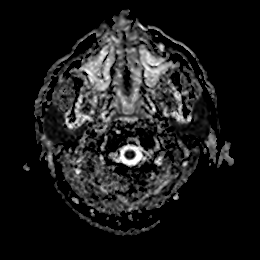
[im 13/37]
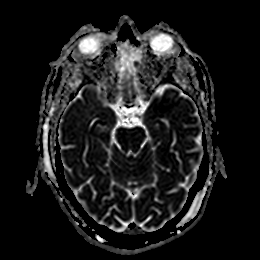
[im 25/37]
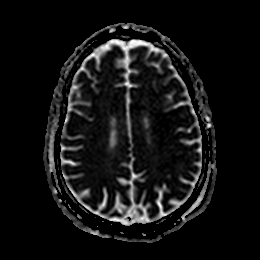
[im 37/37]
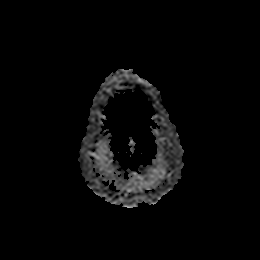

[Series 7: DWI · coronal · 4.0mm · 0.88mm/px · 7 of 72 slices shown (3 of 4)]
[im 1/72]
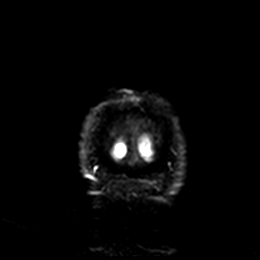
[im 12/72]
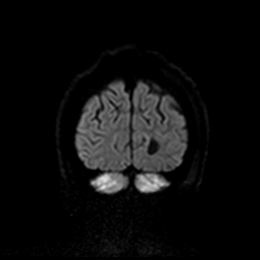
[im 24/72]
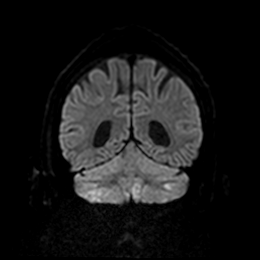
[im 36/72]
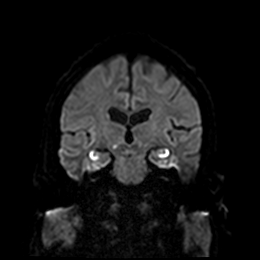
[im 48/72]
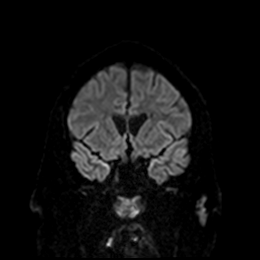
[im 60/72]
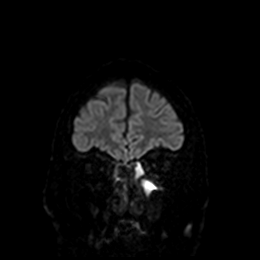
[im 72/72]
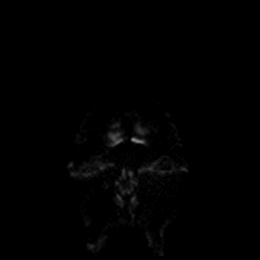

[Series 8: DWI · coronal · 4.0mm · 0.88mm/px · 4 of 36 slices shown (4 of 4)]
[im 1/36]
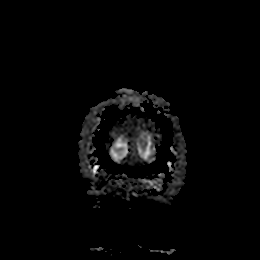
[im 12/36]
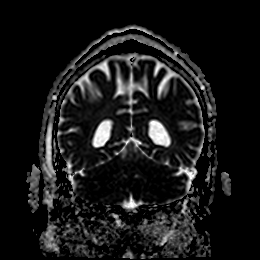
[im 24/36]
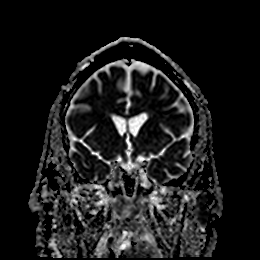
[im 36/36]
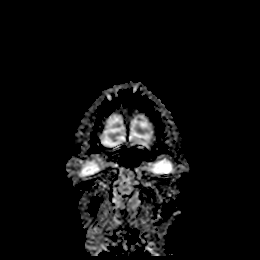

[Series 9: T1 · sagittal · 5.0mm · 0.75mm/px · 2 of 23 slices shown]
[im 1/23]
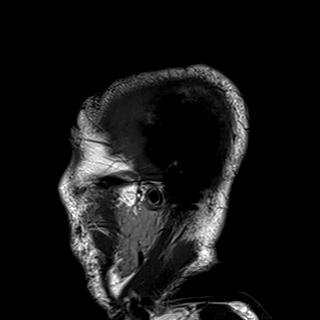
[im 23/23]
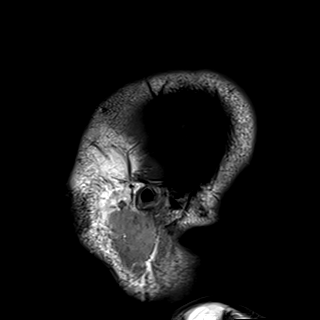

[Series 10: T2 · axial · 5.0mm · 0.72mm/px · z∈[-47,+96]mm · 2 of 25 slices shown (1 of 2)]
[im 1/25]
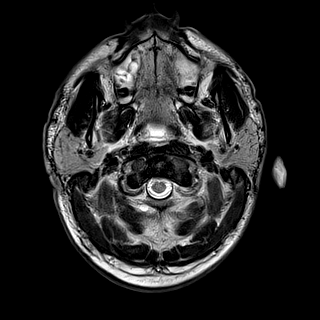
[im 25/25]
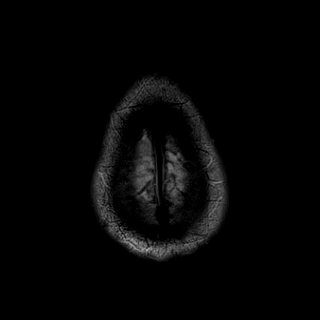

[Series 11: FLAIR · axial · 5.0mm · 0.45mm/px · z∈[-46,+97]mm · 2 of 25 slices shown]
[im 1/25]
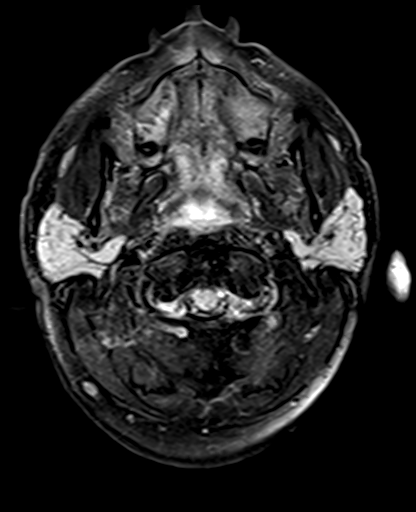
[im 25/25]
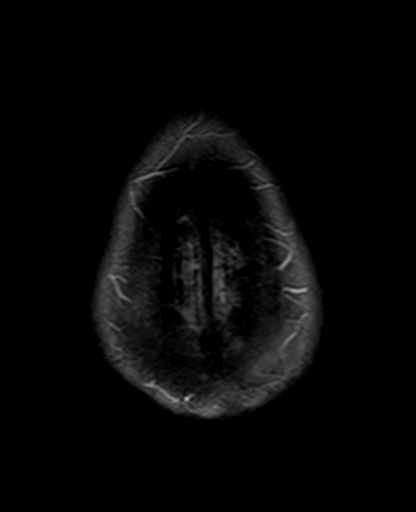

[Series 12: swi_images · axial · 3.0mm · 0.90mm/px · z∈[-56,+120]mm · 6 of 60 slices shown]
[im 1/60]
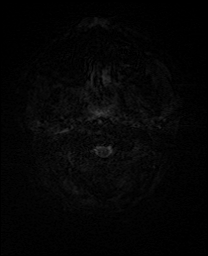
[im 12/60]
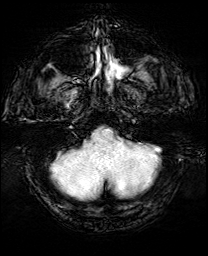
[im 24/60]
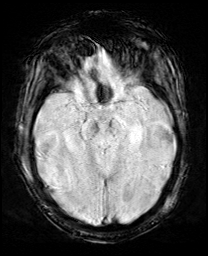
[im 36/60]
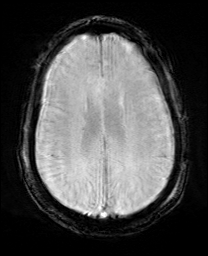
[im 48/60]
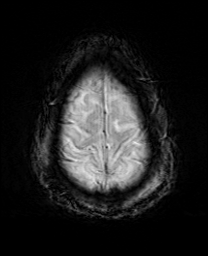
[im 60/60]
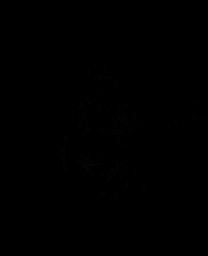

[Series 13: mip_images(sw) · axial · 24.0mm · 0.90mm/px · z∈[-46,+110]mm · 5 of 53 slices shown]
[im 1/53]
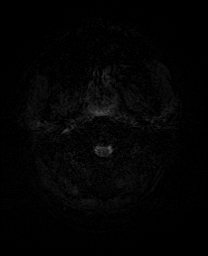
[im 14/53]
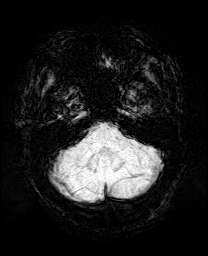
[im 27/53]
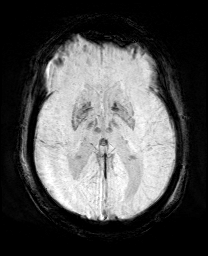
[im 40/53]
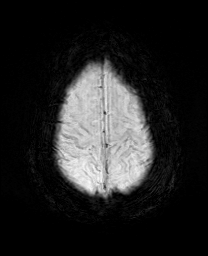
[im 53/53]
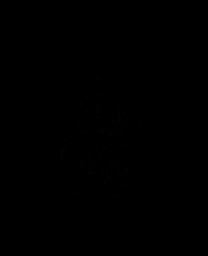

[Series 15: T2 · coronal · 5.0mm · 0.72mm/px · 3 of 28 slices shown (2 of 2)]
[im 1/28]
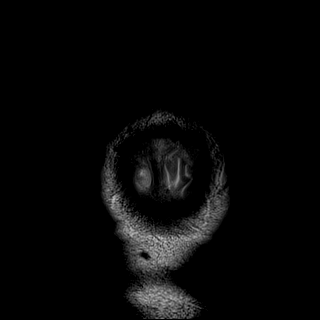
[im 14/28]
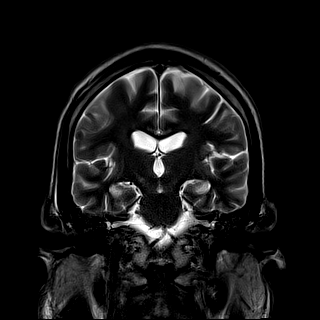
[im 28/28]
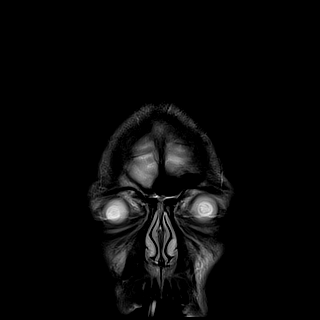

[42 of 48 positions shown; findings below may reference images not displayed]

FINDINGS: BRAIN: There is abnormal diffusion restriction within both medial
temporal lobes and at multiple locations within the cerebellum.
There is a small amount of diffusion restriction in the left
occipital lobe. The midline structures are normal. No midline shift
or other mass effect. Mildly advanced atrophy for age.
Susceptibility-sensitive sequences show no chronic microhemorrhage
or superficial siderosis.

VASCULAR: Major intracranial arterial and venous sinus flow voids
are normal.

SKULL AND UPPER CERVICAL SPINE: Calvarial bone marrow signal is
normal. There is no skull base mass. Visualized upper cervical spine
and soft tissues are normal.

SINUSES/ORBITS: Diffuse paranasal sinus opacification. The orbits
are normal.
IMPRESSION: 1. Multifocal abnormal diffusion restriction, predominantly within
the mesial temporal lobes and bilateral cerebellar hemispheres. This
pattern is consistent with drug overdose, most typically heroin
leukoencephalopathy.
2. No hemorrhage or mass effect.

## 2019-09-01 IMAGING — DX DG CHEST 1V PORT
1 series · 1 of 1 positions shown · non-contrast
Comparison: Yesterday

CLINICAL DATA: Respiratory failure with endotracheal tube

EXAM:
PORTABLE CHEST 1 VIEW

[chest]
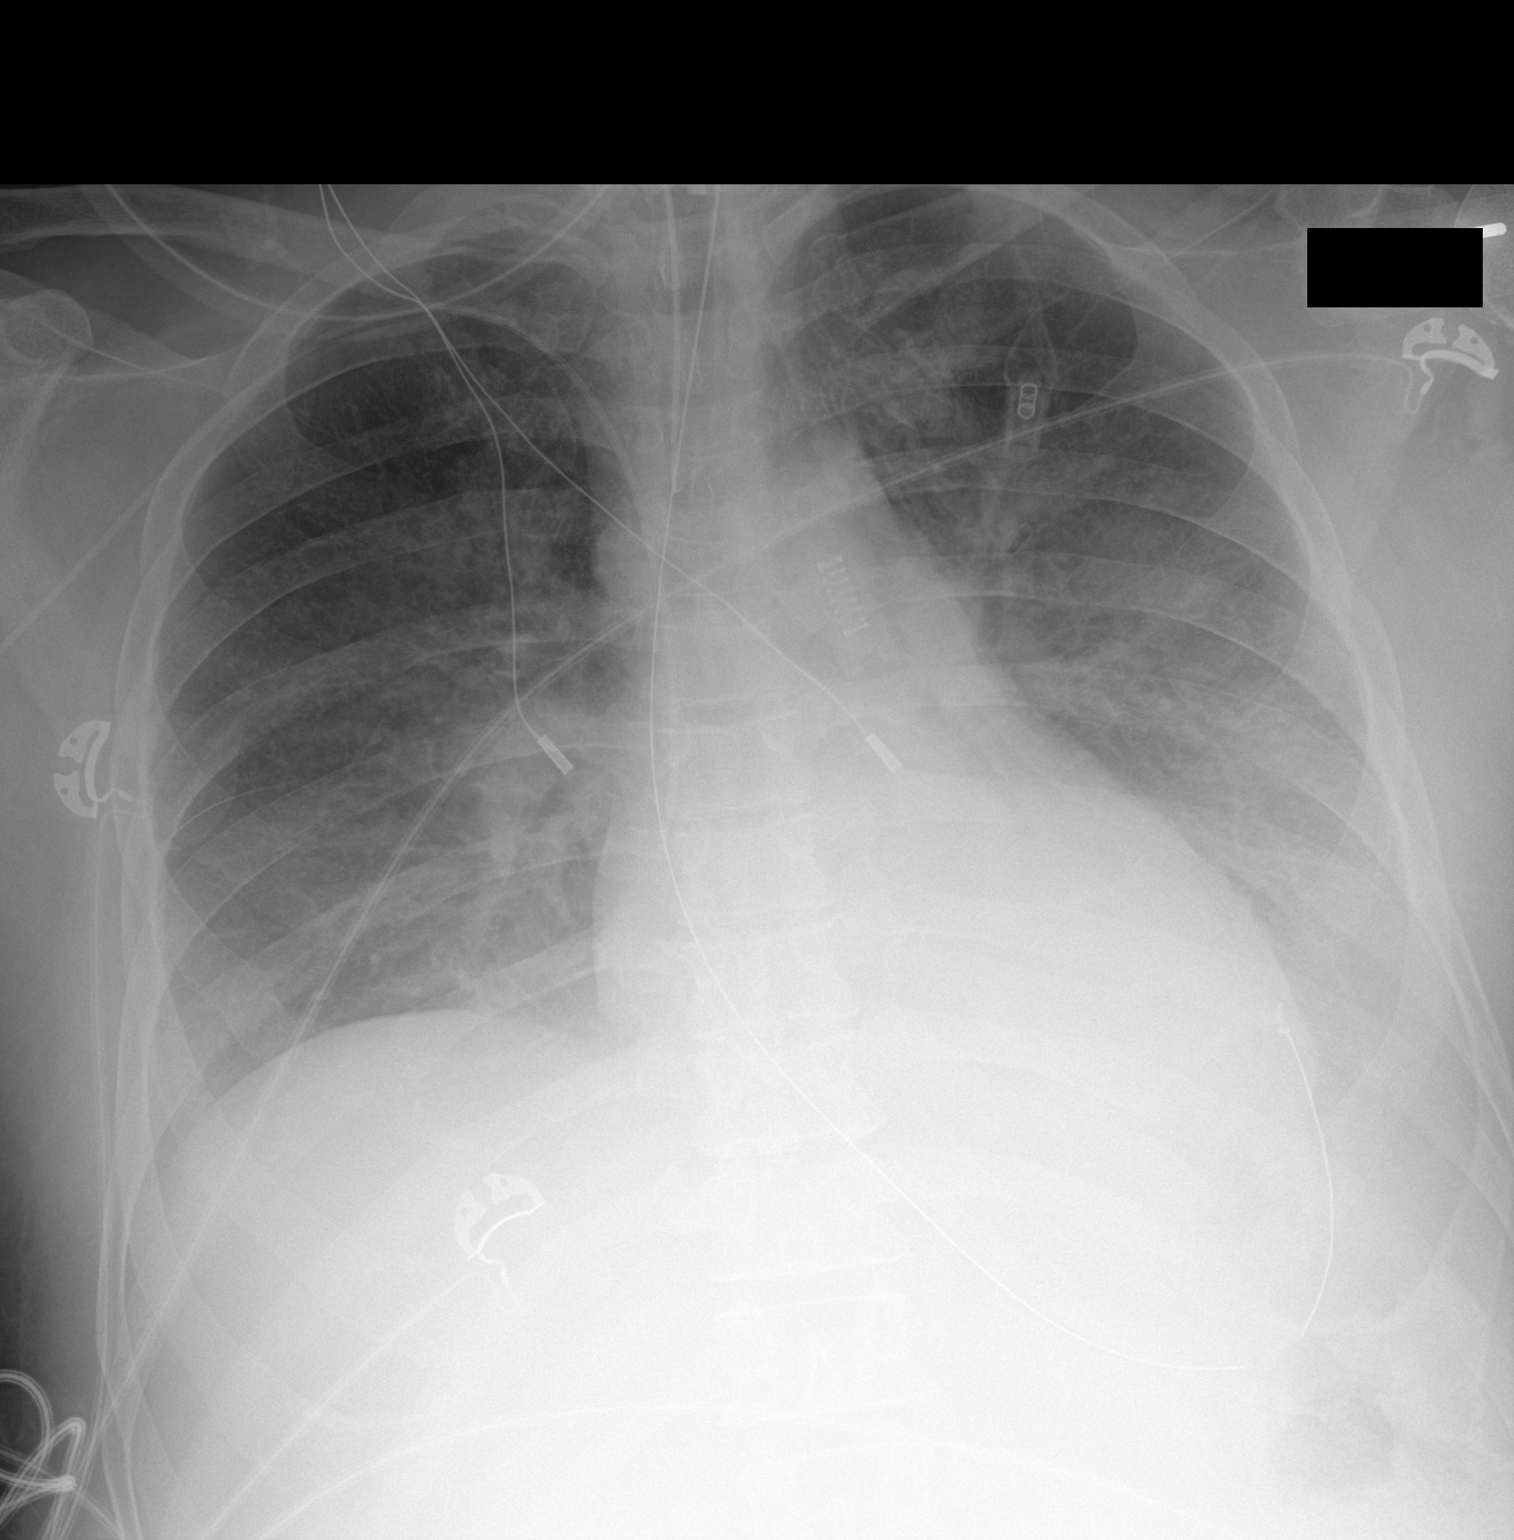

[1 of 1 positions shown; findings below may reference images not displayed]

FINDINGS: Endotracheal tube tip between the clavicular heads and carina. An
orogastric tube reaches the stomach. Right upper extremity PICC with
tip at the upper cavoatrial junction. Worsening hazy opacification
of the bilateral chest, suspect layering pleural fluid. The
superimposed lung is also opacified. No Kerley lines or
pneumothorax.
IMPRESSION: 1. New PICC with tip at the upper cavoatrial junction.
2. Stable endotracheal and orogastric tube positioning.
3. Known airspace disease with increased hazy opacity at the left
base, likely pleural fluid.
